# Patient Record
Sex: Female | Born: 1980 | Race: White | Hispanic: No | Marital: Single | Smoking: Current every day smoker
Health system: Southern US, Academic
[De-identification: ages and names within clinical notes are randomized; demographics above are authoritative.]

## PROBLEM LIST (undated history)

## (undated) DIAGNOSIS — F419 Anxiety disorder, unspecified: Secondary | ICD-10-CM

## (undated) DIAGNOSIS — F32A Depression, unspecified: Secondary | ICD-10-CM

## (undated) DIAGNOSIS — F1990 Other psychoactive substance use, unspecified, uncomplicated: Secondary | ICD-10-CM

## (undated) DIAGNOSIS — T50901A Poisoning by unspecified drugs, medicaments and biological substances, accidental (unintentional), initial encounter: Secondary | ICD-10-CM

## (undated) HISTORY — PX: CHOLECYSTECTOMY: SHX55

---

## 2008-01-12 ENCOUNTER — Emergency Department: Payer: Self-pay | Admitting: Unknown Physician Specialty

## 2008-08-13 ENCOUNTER — Emergency Department: Payer: Self-pay | Admitting: Emergency Medicine

## 2008-09-05 ENCOUNTER — Emergency Department: Payer: Self-pay | Admitting: Emergency Medicine

## 2009-08-06 ENCOUNTER — Emergency Department: Payer: Self-pay | Admitting: Emergency Medicine

## 2009-11-08 ENCOUNTER — Observation Stay: Payer: Self-pay | Admitting: Obstetrics and Gynecology

## 2010-11-03 ENCOUNTER — Ambulatory Visit: Payer: Self-pay | Admitting: Family Medicine

## 2015-11-15 ENCOUNTER — Emergency Department: Payer: Medicaid Other

## 2015-11-15 ENCOUNTER — Encounter: Payer: Self-pay | Admitting: Emergency Medicine

## 2015-11-15 ENCOUNTER — Other Ambulatory Visit: Payer: Self-pay

## 2015-11-15 ENCOUNTER — Emergency Department
Admission: EM | Admit: 2015-11-15 | Discharge: 2015-11-15 | Disposition: A | Discharge status: Home / Self Care | Payer: Medicaid Other | Attending: Emergency Medicine | Admitting: Emergency Medicine

## 2015-11-15 DIAGNOSIS — F17200 Nicotine dependence, unspecified, uncomplicated: Secondary | ICD-10-CM | POA: Insufficient documentation

## 2015-11-15 DIAGNOSIS — N3090 Cystitis, unspecified without hematuria: Secondary | ICD-10-CM | POA: Insufficient documentation

## 2015-11-15 DIAGNOSIS — F1410 Cocaine abuse, uncomplicated: Secondary | ICD-10-CM | POA: Diagnosis not present

## 2015-11-15 DIAGNOSIS — R0602 Shortness of breath: Secondary | ICD-10-CM | POA: Diagnosis present

## 2015-11-15 LAB — URINALYSIS COMPLETE WITH MICROSCOPIC (ARMC ONLY)
Bilirubin Urine: NEGATIVE
Glucose, UA: NEGATIVE mg/dL
Nitrite: POSITIVE — AB
PH: 5 (ref 5.0–8.0)
PROTEIN: 30 mg/dL — AB
Specific Gravity, Urine: 1.025 (ref 1.005–1.030)

## 2015-11-15 LAB — TROPONIN I: Troponin I: <0.03 ng/mL (ref <0.031)

## 2015-11-15 LAB — CBC WITH DIFFERENTIAL/PLATELET
Basophils Absolute: 0 10*3/uL (ref 0–0.1)
Basophils Relative: 0 %
Eosinophils Absolute: 0 10*3/uL (ref 0–0.7)
Eosinophils Relative: 0 %
HCT: 40 % (ref 35.0–47.0)
HEMOGLOBIN: 14 g/dL (ref 12.0–16.0)
LYMPHS ABS: 1.2 10*3/uL (ref 1.0–3.6)
LYMPHS PCT: 17 %
MCH: 30.4 pg (ref 26.0–34.0)
MCHC: 35 g/dL (ref 32.0–36.0)
MCV: 86.7 fL (ref 80.0–100.0)
Monocytes Absolute: 0.5 10*3/uL (ref 0.2–0.9)
Monocytes Relative: 7 %
NEUTROS ABS: 5.2 10*3/uL (ref 1.4–6.5)
NEUTROS PCT: 76 %
Platelets: 170 10*3/uL (ref 150–440)
RBC: 4.61 MIL/uL (ref 3.80–5.20)
RDW: 13.6 % (ref 11.5–14.5)
WBC: 6.9 10*3/uL (ref 3.6–11.0)

## 2015-11-15 LAB — BASIC METABOLIC PANEL
ANION GAP: 7 (ref 5–15)
BUN: 14 mg/dL (ref 6–20)
CHLORIDE: 106 mmol/L (ref 101–111)
CO2: 24 mmol/L (ref 22–32)
CREATININE: 0.67 mg/dL (ref 0.44–1.00)
Calcium: 9.2 mg/dL (ref 8.9–10.3)
GFR calc non Af Amer: >60 mL/min (ref >60)
Glucose, Bld: 115 mg/dL — ABNORMAL HIGH (ref 65–99)
Potassium: 3.6 mmol/L (ref 3.5–5.1)
Sodium: 137 mmol/L (ref 135–145)

## 2015-11-15 LAB — POCT PREGNANCY, URINE: PREG TEST UR: NEGATIVE

## 2015-11-15 LAB — CK: Total CK: 189 U/L (ref 38–234)

## 2015-11-15 MED ORDER — FOSFOMYCIN TROMETHAMINE 3 G PO PACK
3.0000 g | PACK | Freq: Once | ORAL | Status: AC
  Administered 2015-11-15: 3 g via ORAL
  Filled 2015-11-15: qty 3

## 2015-11-15 MED ORDER — SODIUM CHLORIDE 0.9 % IV BOLUS (SEPSIS)
1000.0000 mL | Freq: Once | INTRAVENOUS | Status: AC
  Administered 2015-11-15: 1000 mL via INTRAVENOUS

## 2015-11-15 MED ORDER — LORAZEPAM 1 MG PO TABS
1.0000 mg | ORAL_TABLET | Freq: Once | ORAL | Status: AC
  Administered 2015-11-15: 1 mg via ORAL
  Filled 2015-11-15: qty 1

## 2015-11-15 NOTE — Discharge Instructions (Signed)
Stimulant Use Disorder-Cocaine Cocaine is one of a group of powerful drugs called stimulants. Cocaine has medical uses for stopping nosebleeds and for pain control before minor nose or dental surgery. However, cocaine is misused because of the effects that it produces. These effects include:   A feeling of extreme pleasure.  Alertness.  High energy. Common street names for cocaine include coke, crack, blow, snow, and nose candy. Cocaine is snorted, dissolved in water and injected, or smoked.  Stimulants are addictive because they activate regions of the brain that produce both the pleasurable sensation of "reward" and psychological dependence. Together, these actions account for loss of control and the rapid development of drug dependence. This means you become ill without the drug (withdrawal) and need to keep using it to function.  Stimulant use disorder is use of stimulants that disrupts your daily life. It disrupts relationships with family and friends and how you do your job. Cocaine increases your blood pressure and heart rate. It can cause a heart attack or stroke. Cocaine can also cause death from irregular heart rate or seizures. SYMPTOMS Symptoms of stimulant use disorder with cocaine include:  Use of cocaine in larger amounts or over a longer period of time than intended.  Unsuccessful attempts to cut down or control cocaine use.  A lot of time spent obtaining, using, or recovering from the effects of cocaine.  A strong desire or urge to use cocaine (craving).  Continued use of cocaine in spite of major problems at work, school, or home because of use.  Continued use of cocaine in spite of relationship problems because of use.  Giving up or cutting down on important life activities because of cocaine use.  Use of cocaine over and over in situations when it is physically hazardous, such as driving a car.  Continued use of cocaine in spite of a physical problem that is likely  related to use. Physical problems can include:  Malnutrition.  Nosebleeds.  Chest pain.  High blood pressure.  A hole that develops between the part of your nose that separates your nostrils (perforated nasal septum).  Lung and kidney damage.  Continued use of cocaine in spite of a mental problem that is likely related to use. Mental problems can include:  Schizophrenia-like symptoms.  Depression.  Bipolar mood swings.  Anxiety.  Sleep problems.  Need to use more and more cocaine to get the same effect, or lessened effect over time with use of the same amount of cocaine (tolerance).  Having withdrawal symptoms when cocaine use is stopped, or using cocaine to reduce or avoid withdrawal symptoms. Withdrawal symptoms include:  Depressed or irritable mood.  Low energy or restlessness.  Bad dreams.  Poor or excessive sleep.  Increased appetite. DIAGNOSIS Stimulant use disorder is diagnosed by your health care provider. You may be asked questions about your cocaine use and how it affects your life. A physical exam may be done. A drug screen may be ordered. You may be referred to a mental health professional. The diagnosis of stimulant use disorder requires at least two symptoms within 12 months. The type of stimulant use disorder depends on the number of signs and symptoms you have. The type may be:  Mild. Two or three signs and symptoms.  Moderate. Four or five signs and symptoms.  Severe. Six or more signs and symptoms. TREATMENT Treatment for stimulant use disorder is usually provided by mental health professionals with training in substance use disorders. The following options are available:  Counseling or talk therapy. Talk therapy addresses the reasons you use cocaine and ways to keep you from using again. Goals of talk therapy include:  Identifying and avoiding triggers for use.  Handling cravings.  Replacing use with healthy activities.  Support groups.  Support groups provide emotional support, advice, and guidance.  Medicine. Certain medicines may decrease cocaine cravings or withdrawal symptoms. HOME CARE INSTRUCTIONS  Take medicines only as directed by your health care provider.  Identify the people and activities that trigger your cocaine use and avoid them.  Keep all follow-up visits as directed by your health care provider. SEEK MEDICAL CARE IF:  Your symptoms get worse or you relapse.  You are not able to take medicines as directed. SEEK IMMEDIATE MEDICAL CARE IF:  You have serious thoughts about hurting yourself or others.  You have a seizure, chest pain, sudden weakness, or loss of speech or vision. FOR MORE INFORMATION  National Institute on Drug Abuse: http://www.price-Korell.com/  Substance Abuse and Mental Health Services Administration: SkateOasis.com.pt   This information is not intended to replace advice given to you by your health care provider. Make sure you discuss any questions you have with your health care provider.   Document Released: 05/06/2000 Document Revised: 05/30/2014 Document Reviewed: 05/22/2013 Elsevier Interactive Patient Education 2016 [employer].  Polysubstance Abuse When people abuse more than one drug or type of drug it is called polysubstance or polydrug abuse. For example, many smokers also drink alcohol. This is one form of polydrug abuse. Polydrug abuse also refers to the use of a drug to counteract an unpleasant effect produced by another drug. It may also be used to help with withdrawal from another drug. People who take stimulants may become agitated. Sometimes this agitation is countered with a tranquilizer. This helps protect against the unpleasant side effects. Polydrug abuse also refers to the use of different drugs at the same time.  Anytime drug use is interfering with normal living activities, it has become abuse. This includes problems with family and friends. Psychological dependence has  developed when your mind tells you that the drug is needed. This is usually followed by physical dependence which has developed when continuing increases of drug are required to get the same feeling or "high". This is known as addiction or chemical dependency. A person's risk is much higher if there is a history of chemical dependency in the family. SIGNS OF CHEMICAL DEPENDENCY  You have been told by friends or family that drugs have become a problem.  You fight when using drugs.  You are having blackouts (not remembering what you do while using).  You feel sick from using drugs but continue using.  You lie about use or amounts of drugs (chemicals) used.  You need chemicals to get you going.  You are suffering in work performance or in school because of drug use.  You get sick from use of drugs but continue to use anyway.  You need drugs to relate to people or feel comfortable in social situations.  You use drugs to forget problems. "Yes" answered to any of the above signs of chemical dependency indicates there are problems. The longer the use of drugs continues, the greater the problems will become. If there is a family history of drug or alcohol use, it is best not to experiment with these drugs. Continual use leads to tolerance. After tolerance develops more of the drug is needed to get the same feeling. This is followed by addiction. With addiction,  drugs become the most important part of life. It becomes more important to take drugs than participate in the other usual activities of life. This includes relating to friends and family. Addiction is followed by dependency. Dependency is a condition where drugs are now needed not just to get high, but to feel normal. Addiction cannot be cured but it can be stopped. This often requires outside help and the care of professionals. Treatment centers are listed in the yellow pages under: Cocaine, Narcotics, and Alcoholics Anonymous. Most hospitals  and clinics can refer you to a specialized care center. Talk to your caregiver if you need help.   This information is not intended to replace advice given to you by your health care provider. Make sure you discuss any questions you have with your health care provider.   Document Released: 12/29/2004 Document Revised: 08/01/2011 Document Reviewed: 05/14/2014 Elsevier Interactive Patient Education 2016 Elsevier Inc.  Urinary Tract Infection Urinary tract infections (UTIs) can develop anywhere along your urinary tract. Your urinary tract is your body's drainage system for removing wastes and extra water. Your urinary tract includes two kidneys, two ureters, a bladder, and a urethra. Your kidneys are a pair of bean-shaped organs. Each kidney is about the size of your fist. They are located below your ribs, one on each side of your spine. CAUSES Infections are caused by microbes, which are microscopic organisms, including fungi, viruses, and bacteria. These organisms are so small that they can only be seen through a microscope. Bacteria are the microbes that most commonly cause UTIs. SYMPTOMS  Symptoms of UTIs may vary by age and gender of the patient and by the location of the infection. Symptoms in young women typically include a frequent and intense urge to urinate and a painful, burning feeling in the bladder or urethra during urination. Older women and men are more likely to be tired, shaky, and weak and have muscle aches and abdominal pain. A fever may mean the infection is in your kidneys. Other symptoms of a kidney infection include pain in your back or sides below the ribs, nausea, and vomiting. DIAGNOSIS To diagnose a UTI, your caregiver will ask you about your symptoms. Your caregiver will also ask you to provide a urine sample. The urine sample will be tested for bacteria and white blood cells. White blood cells are made by your body to help fight infection. TREATMENT  Typically, UTIs can be  treated with medication. Because most UTIs are caused by a bacterial infection, they usually can be treated with the use of antibiotics. The choice of antibiotic and length of treatment depend on your symptoms and the type of bacteria causing your infection. HOME CARE INSTRUCTIONS  If you were prescribed antibiotics, take them exactly as your caregiver instructs you. Finish the medication even if you feel better after you have only taken some of the medication.  Drink enough water and fluids to keep your urine clear or pale yellow.  Avoid caffeine, tea, and carbonated beverages. They tend to irritate your bladder.  Empty your bladder often. Avoid holding urine for long periods of time.  Empty your bladder before and after sexual intercourse.  After a bowel movement, women should cleanse from front to back. Use each tissue only once. SEEK MEDICAL CARE IF:   You have back pain.  You develop a fever.  Your symptoms do not begin to resolve within 3 days. SEEK IMMEDIATE MEDICAL CARE IF:   You have severe back pain or lower abdominal  pain.  You develop chills.  You have nausea or vomiting.  You have continued burning or discomfort with urination. MAKE SURE YOU:   Understand these instructions.  Will watch your condition.  Will get help right away if you are not doing well or get worse.   This information is not intended to replace advice given to you by your health care provider. Make sure you discuss any questions you have with your health care provider.   Document Released: 02/16/2005 Document Revised: 01/28/2015 Document Reviewed: 06/17/2011 Elsevier Interactive Patient Education 2016 [employer]. [employer] CorporationCommunity Resource Guide Outpatient Counseling/Substance Abuse Adult The United Ways 211 is a great source of information about community services available.  Access by dialing 2-1-1 from anywhere in [place], or by website -  PooledIncome.plwww.nc211.org.   Other Local Resources  (Updated 05/2015)  Crisis Hotlines   Services     Area Served  [facility]  Crisis Hotline, available 24 hours a day, 7 days a week: [phone] [facility] [place], [place]   [facility]  Crisis Hotline, available 24 hours a day, 7 days a week: [phone] [facility], [place]  [facility]  Suicide Prevention Hotline, available 24 hours a day, 7 days a week: [phone] [facility] [place], [place]  [facility]   Crisis Hotline, available 24 hours a day, 7 days a week: [phone] [facility] [place], [place]   [facility] Access to [employer] Line  Crisis Hotline, available 24 hours a day, 7 days a week: [phone] All   [facility]  Crisis Hotline, available 24 hours a day, 7 days a week: [phone] All   Other Local Resources (Updated 05/2015)  Outpatient Counseling/ Substance Abuse Programs  Services     Address and Phone Number  ADS (Alcohol and Drug Services)   Options include Individual counseling, group counseling, intensive outpatient program (several hours a day, several days a week)  Offers depression assessments  Provides methadone maintenance program [phone] [address], [address] [place], [place] [address]   [facility]   Offers partial hospitalization/day treatment and DUI/DWI programs  [employer]s Medicare, private insurance [phone] [address], [address] [place], [place] [address]  [facility]    Services include intensive outpatient program (several hours a day, several days a week), outpatient treatment, DUI/DWI services, family education  Also has some services specifically for [employer]  Offers transitional housing  [phone] [address] [place], [place] [address]     [facility]  [employer]s Medicare, private pay, and private insurance [phone] [address], [address] [place], [place] [address]  [facility]  Services include  individual counseling, substance abuse intensive outpatient program (several hours a day, several days a week), day treatment  Delene Lollccepts Medicare, Medicaid, private insurance [phone] [address], [address] [place], [place] [address]  [facility]   Offers substance abuse intensive outpatient program (several hours a day, several days a week), partial hospitalization program [phone] [address] [place], [place] [address]  [phone] [address] [place], [place] [address]  [phone] [address] [place], [place] [address]  [phone] [address] [place], [place] [address]  [facility]  Individual counseling only  Accepts private insurance only [phone] [address], [address] [place], [place] [address]  [facility]  Methadone maintenance program [phone] [address] [place], [place] [address]  [facility]  Walk-In Clinic providing substance abuse and mental health counseling  Accepts Medicaid,  Medicare, private insurance  Offers sliding scale for uninsured [phone] [address] [place], [place]   [facility] [place], [employer].  Offers individual counseling, and intensive in-home services [phone] [address] [place], [place] [address]  [facility] individual counseling, family counseling, group therapy, domestic violence counseling, consumer credit counseling  Accepts Medicare, Medicaid, private insurance  Offers sliding scale for uninsured [phone] [address] [place], [place] [address]  [phone] [facility], [address] [place], [place] [address]  [facility]  Offers individual, family and group counseling  3 locations - [place], [place], and [place]  [phone]  [address] [place], [place] [address]  [address] [place], [place] [address]  [address] [place], [place] [address]  [facility]    Offers psychiatric assessment, 8-week Intensive Outpatient Program (several hours a day, several times a week, daytime or evenings), early recovery group, family Program, medication management  Private pay or private insurance only [phone], or  [phone] [address] [place], [place] [address]  [place] [employer] individual, couples and family counseling  Accepts Medicaid, private insurance, and sliding scale for uninsured [phone] [address] [place], [place] [address]  Len Blalock, MD  Individual counseling  Private insurance [phone] [address] [place], [place] [address]  [facility]   Offers assessment, substance abuse treatment, and behavioral health treatment [phone] [address] [place], [place] [address]  [facility]  Individual counseling  Accepts private insurance [phone] [address] [place], [place] [address]  [facility]  Individual counseling  Delene Loll, private insurance [phone] [address] [place], [place] [address]  [facility]    Offers intensive outpatient program (several hours a day, several times a week)  Private pay, private insurance [phone] [address] [place], [place]  [facility]  Individual counseling  Medicare, private insurance [phone] [address] [place], [place] [address]  [facility]    Offers intensive outpatient program (several hours a day, several times a week) and partial hospitalization program [phone] [address] [place], [place] [address]  Emerson Monte, MD  Individual counseling [phone] [address], [address] [place], [place] [address]  [facility]  Offers Christian counseling to individuals, couples,  and families  Accepts Medicare and private insurance; offers sliding scale for uninsured [phone] [address] [place], [place] [address]  [facility]  May counseling [phone] [address] [place], [place] [address]  [facility] crisis counseling, individual counseling, group therapy, in-home therapy, domestic violence services, day treatment, DWI services, Administrator, arts (CST), Assertive Community Treatment Team (ACTT), substance abuse Intensive Outpatient Program (several hours a day, several times a week)  2 locations - [place] and [place] [phone] [address] [place], [place] [address]  [phone] [address] [place], [place] [address]  [facility]     Individual counseling and group therapy  Accepts private insurance, [place], [place] [phone] [address] [place], [place]  [facility]  Offers individual and family counseling  Offers LGBTQ services  Accepts private insurance and private pay [phone] [address] [place], [place] [address]  [facility]    Offers individual counseling, group therapy, and outpatient detox  Accepts private insurance [phone] [address] [place], [place]  [facility]  Individual  counseling  Accepts Medicare, private insurance [phone] [address] [place], [place] [address]  [employer]  Individual counseling  Accepts Medicare, private insurance [phone] [address] [place], [place] [address]  [facility]   Offers substance abuse Intensive Outpatient Program (several hours a day, several times a week) [phone], or [phone] [place], [place]

## 2015-11-15 NOTE — ED Notes (Signed)
Pt received bed change.
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
## 2015-11-15 NOTE — ED Notes (Signed)
Pt verbalized understanding of discharge instructions. Pt given information for [facility] and other facilities in the area. NAD at this time.   JA

## 2015-11-15 NOTE — ED Notes (Signed)
Patient brought in by ems from home. Patient reports using cocaine and methadone all day. Patient started hallucinating that there were people in her house.

## 2015-11-15 NOTE — ED Notes (Signed)
Pt requested psych consult.

## 2015-11-15 NOTE — ED Notes (Signed)
Pt attempting to contact friend/family for ride home.

## 2015-11-17 LAB — URINE CULTURE: Culture: >=100000 — AB

## 2016-07-18 ENCOUNTER — Emergency Department: Payer: Self-pay

## 2016-07-18 ENCOUNTER — Encounter: Payer: Self-pay | Admitting: Emergency Medicine

## 2016-07-18 ENCOUNTER — Emergency Department
Admission: EM | Admit: 2016-07-18 | Discharge: 2016-07-19 | Disposition: A | Discharge status: Other Acute Inpatient Hospital | Payer: Self-pay | Attending: Emergency Medicine | Admitting: Emergency Medicine

## 2016-07-18 DIAGNOSIS — F15951 Other stimulant use, unspecified with stimulant-induced psychotic disorder with hallucinations: Secondary | ICD-10-CM

## 2016-07-18 DIAGNOSIS — F1590 Other stimulant use, unspecified, uncomplicated: Secondary | ICD-10-CM

## 2016-07-18 DIAGNOSIS — F332 Major depressive disorder, recurrent severe without psychotic features: Secondary | ICD-10-CM

## 2016-07-18 DIAGNOSIS — F1593 Other stimulant use, unspecified with withdrawal: Secondary | ICD-10-CM

## 2016-07-18 DIAGNOSIS — N390 Urinary tract infection, site not specified: Secondary | ICD-10-CM | POA: Insufficient documentation

## 2016-07-18 DIAGNOSIS — F1120 Opioid dependence, uncomplicated: Secondary | ICD-10-CM

## 2016-07-18 DIAGNOSIS — F1920 Other psychoactive substance dependence, uncomplicated: Secondary | ICD-10-CM

## 2016-07-18 DIAGNOSIS — Z5181 Encounter for therapeutic drug level monitoring: Secondary | ICD-10-CM | POA: Insufficient documentation

## 2016-07-18 DIAGNOSIS — G9340 Encephalopathy, unspecified: Secondary | ICD-10-CM | POA: Insufficient documentation

## 2016-07-18 DIAGNOSIS — F1994 Other psychoactive substance use, unspecified with psychoactive substance-induced mood disorder: Secondary | ICD-10-CM | POA: Insufficient documentation

## 2016-07-18 DIAGNOSIS — F1910 Other psychoactive substance abuse, uncomplicated: Secondary | ICD-10-CM

## 2016-07-18 DIAGNOSIS — F17210 Nicotine dependence, cigarettes, uncomplicated: Secondary | ICD-10-CM | POA: Insufficient documentation

## 2016-07-18 DIAGNOSIS — F1523 Other stimulant dependence with withdrawal: Secondary | ICD-10-CM | POA: Insufficient documentation

## 2016-07-18 DIAGNOSIS — F1420 Cocaine dependence, uncomplicated: Secondary | ICD-10-CM

## 2016-07-18 HISTORY — DX: Other psychoactive substance use, unspecified, uncomplicated: F19.90

## 2016-07-18 LAB — CBC WITH DIFFERENTIAL/PLATELET
BASOS ABS: 0 10*3/uL (ref 0–0.1)
BASOS PCT: 0 %
Eosinophils Absolute: 0.2 10*3/uL (ref 0–0.7)
Eosinophils Relative: 2 %
HEMATOCRIT: 35 % (ref 35.0–47.0)
Hemoglobin: 12.5 g/dL (ref 12.0–16.0)
Lymphocytes Relative: 30 %
Lymphs Abs: 2.4 10*3/uL (ref 1.0–3.6)
MCH: 32 pg (ref 26.0–34.0)
MCHC: 35.8 g/dL (ref 32.0–36.0)
MCV: 89.5 fL (ref 80.0–100.0)
MONOS PCT: 9 %
Monocytes Absolute: 0.7 10*3/uL (ref 0.2–0.9)
NEUTROS ABS: 4.6 10*3/uL (ref 1.4–6.5)
Neutrophils Relative %: 59 %
Platelets: 167 10*3/uL (ref 150–440)
RBC: 3.92 MIL/uL (ref 3.80–5.20)
RDW: 13 % (ref 11.5–14.5)
WBC: 7.9 10*3/uL (ref 3.6–11.0)

## 2016-07-18 LAB — POCT PREGNANCY, URINE: Preg Test, Ur: NEGATIVE

## 2016-07-18 LAB — COMPREHENSIVE METABOLIC PANEL
ALBUMIN: 3.6 g/dL (ref 3.5–5.0)
ALK PHOS: 76 U/L (ref 38–126)
ALT: 46 U/L (ref 14–54)
AST: 28 U/L (ref 15–41)
Anion gap: 6 (ref 5–15)
BILIRUBIN TOTAL: 0.9 mg/dL (ref 0.3–1.2)
BUN: 14 mg/dL (ref 6–20)
CALCIUM: 8.7 mg/dL — AB (ref 8.9–10.3)
CO2: 28 mmol/L (ref 22–32)
Chloride: 101 mmol/L (ref 101–111)
Creatinine, Ser: 0.54 mg/dL (ref 0.44–1.00)
GFR calc Af Amer: >60 mL/min (ref >60)
GFR calc non Af Amer: >60 mL/min (ref >60)
GLUCOSE: 99 mg/dL (ref 65–99)
Potassium: 3.3 mmol/L — ABNORMAL LOW (ref 3.5–5.1)
SODIUM: 135 mmol/L (ref 135–145)
TOTAL PROTEIN: 6.7 g/dL (ref 6.5–8.1)

## 2016-07-18 LAB — URINALYSIS, COMPLETE (UACMP) WITH MICROSCOPIC
BILIRUBIN URINE: NEGATIVE
Glucose, UA: NEGATIVE mg/dL
KETONES UR: 5 mg/dL — AB
Nitrite: POSITIVE — AB
PH: 5 (ref 5.0–8.0)
Protein, ur: 30 mg/dL — AB
SPECIFIC GRAVITY, URINE: 1.025 (ref 1.005–1.030)

## 2016-07-18 LAB — URINE DRUG SCREEN, QUALITATIVE (ARMC ONLY)
Amphetamines, Ur Screen: POSITIVE — AB
BARBITURATES, UR SCREEN: NOT DETECTED
BENZODIAZEPINE, UR SCRN: NOT DETECTED
COCAINE METABOLITE, UR ~~LOC~~: POSITIVE — AB
Cannabinoid 50 Ng, Ur ~~LOC~~: NOT DETECTED
MDMA (Ecstasy)Ur Screen: NOT DETECTED
Methadone Scn, Ur: NOT DETECTED
OPIATE, UR SCREEN: POSITIVE — AB
PHENCYCLIDINE (PCP) UR S: NOT DETECTED
Tricyclic, Ur Screen: NOT DETECTED

## 2016-07-18 LAB — CK: CK TOTAL: 88 U/L (ref 38–234)

## 2016-07-18 LAB — LACTIC ACID, PLASMA: Lactic Acid, Venous: 0.7 mmol/L (ref 0.5–1.9)

## 2016-07-18 MED ORDER — SODIUM CHLORIDE 0.9 % IV BOLUS (SEPSIS)
1000.0000 mL | Freq: Once | INTRAVENOUS | Status: AC
  Administered 2016-07-18: 1000 mL via INTRAVENOUS

## 2016-07-18 MED ORDER — NALOXONE HCL 2 MG/2ML IJ SOSY
0.4000 mg | PREFILLED_SYRINGE | Freq: Once | INTRAMUSCULAR | Status: AC
  Administered 2016-07-18: 0.4 mg via INTRAVENOUS
  Filled 2016-07-18: qty 2

## 2016-07-18 MED ORDER — ONDANSETRON HCL 4 MG/2ML IJ SOLN
4.0000 mg | Freq: Once | INTRAMUSCULAR | Status: AC
  Administered 2016-07-18: 4 mg via INTRAVENOUS
  Filled 2016-07-18: qty 2

## 2016-07-18 MED ORDER — PROMETHAZINE HCL 12.5 MG PO TABS: 12.5000 mg | ORAL_TABLET | Freq: Four times a day (QID) | ORAL | 0 refills | Status: DC | PRN

## 2016-07-18 MED ORDER — CLONIDINE HCL 0.1 MG PO TABS: 0.1000 mg | ORAL_TABLET | Freq: Two times a day (BID) | ORAL | 0 refills | Status: DC

## 2016-07-18 MED ORDER — CEPHALEXIN 500 MG PO CAPS: 500.0000 mg | ORAL_CAPSULE | Freq: Four times a day (QID) | ORAL | 0 refills | Status: DC

## 2016-07-18 MED ORDER — CEPHALEXIN 500 MG PO CAPS
500.0000 mg | ORAL_CAPSULE | Freq: Once | ORAL | Status: AC
  Administered 2016-07-18: 500 mg via ORAL
  Filled 2016-07-18: qty 1

## 2016-07-18 NOTE — ED Notes (Signed)

## 2016-07-18 NOTE — ED Notes (Signed)
Pt found IV in her hair. She must have pulled it out at some point from the site in her neck.  LM EDT

## 2016-07-18 NOTE — ED Provider Notes (Addendum)
Patient signed out to me by Dr. Roxan Hockeyobinson. The patient is a 36 year old woman who is been using cocaine and methamphetamine for the past several days and comes to the ER from a homeless shelter for her somnolence. When I evaluated her at this point she is easily arousable but falls asleep while talking to me which is consistent with norepinephrine depletion syndrome. She has a concomitant urinary tract infection. She will require prolonged observation in order for her to adequately wake up. She has a family history of hypertension, uses amphetamines, and has no known past medical history aside from drug abuse.  OBSERVATION TIME BEGAN AT 1500   Merrily BrittleNeil Jermiya Reichl, MD 07/18/16 1704  I attempted to rouse the patient at 2200 and she is still too sleepy to leave.   Merrily BrittleNeil Nishtha Raider, MD 07/18/16 2153

## 2016-07-18 NOTE — ED Notes (Signed)
Patient transported to CT 
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
## 2016-07-18 NOTE — ED Notes (Signed)
TTS at bedside. 

## 2016-07-18 NOTE — ED Triage Notes (Addendum)
Pt in via EMS from homeless shelter; pt states, "everybody thinks I'm high."  Pt lethargic but arousable to touch.  Pt reports last drug use at 0000 today, using heroin and meth.  Pt reports being on a "meth binge for last couple of months and I have not been right since then."  Pt appears to have cellulitis to BLE.  Vitals WDL.  MD to bedside.

## 2016-07-18 NOTE — ED Provider Notes (Signed)
[facility] Emergency Department Provider Note    First MD Initiated Contact with Patient 07/18/16 1232     (approximate)  I have reviewed the triage vital signs and the nursing notes.   HISTORY  Chief Complaint Drug Overdose  Level V Caveat:  Toxicologic encephalopathy, acute  HPI Emily Hutchinson is a 36 y.o. female with polysubstance abuse presents from the homeless shelter because she was drowsy and difficult to arouse. Patient states "everyone thinks I'm high". Patient admits to going on a "binge for the last couple of months "states her last use of heroin was last night. Admits to using meth as well as cocaine. States that she did not sleep last night. No head trauma. Denies any pain. No fevers. No nausea or vomiting.   Past Medical History:  Diagnosis Date  . IV drug user    No family history on file. Past Surgical History:  Procedure Laterality Date  . CESAREAN SECTION    . CHOLECYSTECTOMY     There are no active problems to display for this patient.     Prior to Admission medications   Not on File    Allergies Adhesive [tape]    Social History Social History  Substance Use Topics  . Smoking status: Current Every Day Smoker    Packs/day: 1.00    Types: Cigarettes  . Smokeless tobacco: Never Used  . Alcohol use No    Review of Systems Patient denies headaches, rhinorrhea, blurry vision, numbness, shortness of breath, chest pain, edema, cough, abdominal pain, nausea, vomiting, diarrhea, dysuria, fevers, rashes or hallucinations unless otherwise stated above in HPI. ____________________________________________   PHYSICAL EXAM:  VITAL SIGNS: Vitals:   07/18/16 1224  BP: 120/68  Pulse: 70  Resp: 15  Temp: 98.2 F (36.8 C)    Constitutional: drowsy and intoxicated appearing but  in no acute distress. Eyes: Conjunctivae are normal.  Pupils 2mm and reactive. EOMI. Head: Atraumatic. Nose: No  congestion/rhinnorhea. Mouth/Throat: Mucous membranes are moist.  Oropharynx non-erythematous. Neck: No stridor. Painless ROM. No cervical spine tenderness to palpation Hematological/Lymphatic/Immunilogical: No cervical lymphadenopathy. Cardiovascular: Normal rate, regular rhythm. Grossly normal heart sounds.  Good peripheral circulation. Respiratory: Normal respiratory effort.  No retractions. Lungs CTAB. Gastrointestinal: Soft and nontender. No distention. No abdominal bruits. No CVA tenderness. Musculoskeletal: No lower extremity tenderness nor edema.  No joint effusions. Neurologic:  No gross focal neurologic deficits are appreciated. No facial droop, MAE spontaneously Skin:  Skin is warm, dry and intact. BLE with erythema, no crepitus, induration, or blistering   ____________________________________________   LABS (all labs ordered are listed, but only abnormal results are displayed)  Results for orders placed or performed during the hospital encounter of 07/18/16 (from the past 24 hour(s))  Urine Drug Screen, Qualitative ([facility] only)     Status: Abnormal   Collection Time: 07/18/16 12:33 PM  Result Value Ref Range   Tricyclic, Ur Screen NONE DETECTED NONE DETECTED   Amphetamines, Ur Screen POSITIVE (A) NONE DETECTED   MDMA (Ecstasy)Ur Screen NONE DETECTED NONE DETECTED   Cocaine Metabolite,Ur [place] POSITIVE (A) NONE DETECTED   Opiate, Ur Screen POSITIVE (A) NONE DETECTED   Phencyclidine (PCP) Ur S NONE DETECTED NONE DETECTED   Cannabinoid 50 Ng, Ur [place] NONE DETECTED NONE DETECTED   Barbiturates, Ur Screen NONE DETECTED NONE DETECTED   Benzodiazepine, Ur Scrn NONE DETECTED NONE DETECTED   Methadone Scn, Ur NONE DETECTED NONE DETECTED  Urinalysis, Complete w Microscopic     Status: Abnormal  Collection Time: 07/18/16 12:33 PM  Result Value Ref Range   Color, Urine AMBER (A) YELLOW   APPearance CLEAR (A) CLEAR   Specific Gravity, Urine 1.025 1.005 - 1.030   pH 5.0 5.0 - 8.0    Glucose, UA NEGATIVE NEGATIVE mg/dL   Hgb urine dipstick MODERATE (A) NEGATIVE   Bilirubin Urine NEGATIVE NEGATIVE   Ketones, ur 5 (A) NEGATIVE mg/dL   Protein, ur 30 (A) NEGATIVE mg/dL   Nitrite POSITIVE (A) NEGATIVE   Leukocytes, UA SMALL (A) NEGATIVE   RBC / HPF 0-5 0 - 5 RBC/hpf   WBC, UA 6-30 0 - 5 WBC/hpf   Bacteria, UA MANY (A) NONE SEEN   Squamous Epithelial / LPF 0-5 (A) NONE SEEN   Mucous PRESENT   Pregnancy, urine POC     Status: None   Collection Time: 07/18/16 12:59 PM  Result Value Ref Range   Preg Test, Ur NEGATIVE NEGATIVE  CBC with Differential/Platelet     Status: None   Collection Time: 07/18/16  1:42 PM  Result Value Ref Range   WBC 7.9 3.6 - 11.0 K/uL   RBC 3.92 3.80 - 5.20 MIL/uL   Hemoglobin 12.5 12.0 - 16.0 g/dL   HCT 16.1 09.6 - 04.5 %   MCV 89.5 80.0 - 100.0 fL   MCH 32.0 26.0 - 34.0 pg   MCHC 35.8 32.0 - 36.0 g/dL   RDW 40.9 81.1 - 91.4 %   Platelets 167 150 - 440 K/uL   Neutrophils Relative % 59 %   Neutro Abs 4.6 1.4 - 6.5 K/uL   Lymphocytes Relative 30 %   Lymphs Abs 2.4 1.0 - 3.6 K/uL   Monocytes Relative 9 %   Monocytes Absolute 0.7 0.2 - 0.9 K/uL   Eosinophils Relative 2 %   Eosinophils Absolute 0.2 0 - 0.7 K/uL   Basophils Relative 0 %   Basophils Absolute 0.0 0 - 0.1 K/uL  Comprehensive metabolic panel     Status: Abnormal   Collection Time: 07/18/16  1:42 PM  Result Value Ref Range   Sodium 135 135 - 145 mmol/L   Potassium 3.3 (L) 3.5 - 5.1 mmol/L   Chloride 101 101 - 111 mmol/L   CO2 28 22 - 32 mmol/L   Glucose, Bld 99 65 - 99 mg/dL   BUN 14 6 - 20 mg/dL   Creatinine, Ser 7.82 0.44 - 1.00 mg/dL   Calcium 8.7 (L) 8.9 - 10.3 mg/dL   Total Protein 6.7 6.5 - 8.1 g/dL   Albumin 3.6 3.5 - 5.0 g/dL   AST 28 15 - 41 U/L   ALT 46 14 - 54 U/L   Alkaline Phosphatase 76 38 - 126 U/L   Total Bilirubin 0.9 0.3 - 1.2 mg/dL   GFR calc non Af Amer >60 >60 mL/min   GFR calc Af Amer >60 >60 mL/min   Anion gap 6 5 - 15  Lactic acid, plasma      Status: None   Collection Time: 07/18/16  1:42 PM  Result Value Ref Range   Lactic Acid, Venous 0.7 0.5 - 1.9 mmol/L   ____________________________________________  EKG My review and personal interpretation at Time: 12:41   Indication: od  Rate: 60  Rhythm: sinus Axis: normal Other: normal intervals, no STEMI ____________________________________________  RADIOLOGY   ____________________________________________   PROCEDURES  Procedure(s) performed:  Procedures    Critical Care performed: no ____________________________________________   INITIAL IMPRESSION / ASSESSMENT AND PLAN / ED COURSE  Pertinent  labs & imaging results that were available during my care of the patient were reviewed by me and considered in my medical decision making (see chart for details).  DDX: Dehydration, sepsis, pna, uti, hypoglycemia, cva, drug effect, withdrawal, encephalitis   Vernadine Coombs is a 36 y.o. who presents to the ED with altered mental status as described above. Blood work is reassuring. Patient hemodynamic stable. No significant response to Narcan. He suspect that this is largely secondary to prolonged stimulant abuse "bender."  Renal function is otherwise at baseline. Patient does admit to some dysuria we'll treat empirically for UTI. No evidence of sepsis.  Patient receiving IV fluids. Remains stable monitor. Patient will be signed out to Dr. Adelina Mings pending further monitoring and clinical sobriety.        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     Willy Eddy, MD 07/18/16 [phone]

## 2016-07-18 NOTE — ED Notes (Signed)
TTS complete 

## 2016-07-18 NOTE — ED Notes (Signed)

## 2016-07-18 NOTE — ED Notes (Signed)
Pt very hard stick due to being an IV drug user.  Unable to obtain blood specimens or IV at this time.  MD aware.

## 2016-07-18 NOTE — ED Notes (Signed)
Pt dressed out in burgandy scrubs; all belongings placed in pt belonging bag and labeled with green sticker.  Pt alert, attentive and cooperative at this time.  Pt requesting food; [place] sandwich tray provided.

## 2016-07-18 NOTE — ED Notes (Signed)
Pt resting in bed eating, given warm blanket

## 2016-07-19 ENCOUNTER — Inpatient Hospital Stay
Admit: 2016-07-19 | Discharge: 2016-07-22 | DRG: 897 | Disposition: A | Discharge status: Home / Self Care | Payer: No Typology Code available for payment source | Attending: Psychiatry | Admitting: Psychiatry

## 2016-07-19 ENCOUNTER — Encounter: Payer: Self-pay | Admitting: Psychiatry

## 2016-07-19 DIAGNOSIS — F1420 Cocaine dependence, uncomplicated: Secondary | ICD-10-CM | POA: Diagnosis present

## 2016-07-19 DIAGNOSIS — F17200 Nicotine dependence, unspecified, uncomplicated: Secondary | ICD-10-CM | POA: Diagnosis present

## 2016-07-19 DIAGNOSIS — F15951 Other stimulant use, unspecified with stimulant-induced psychotic disorder with hallucinations: Principal | ICD-10-CM | POA: Diagnosis present

## 2016-07-19 DIAGNOSIS — N390 Urinary tract infection, site not specified: Secondary | ICD-10-CM | POA: Diagnosis present

## 2016-07-19 DIAGNOSIS — F419 Anxiety disorder, unspecified: Secondary | ICD-10-CM | POA: Diagnosis present

## 2016-07-19 DIAGNOSIS — F332 Major depressive disorder, recurrent severe without psychotic features: Secondary | ICD-10-CM | POA: Diagnosis present

## 2016-07-19 DIAGNOSIS — Z590 Homelessness: Secondary | ICD-10-CM | POA: Diagnosis not present

## 2016-07-19 DIAGNOSIS — R45851 Suicidal ideations: Secondary | ICD-10-CM | POA: Diagnosis present

## 2016-07-19 DIAGNOSIS — Z818 Family history of other mental and behavioral disorders: Secondary | ICD-10-CM

## 2016-07-19 DIAGNOSIS — Z915 Personal history of self-harm: Secondary | ICD-10-CM

## 2016-07-19 DIAGNOSIS — F1120 Opioid dependence, uncomplicated: Secondary | ICD-10-CM | POA: Diagnosis present

## 2016-07-19 DIAGNOSIS — Z91048 Other nonmedicinal substance allergy status: Secondary | ICD-10-CM

## 2016-07-19 DIAGNOSIS — G4700 Insomnia, unspecified: Secondary | ICD-10-CM | POA: Diagnosis present

## 2016-07-19 DIAGNOSIS — Z79899 Other long term (current) drug therapy: Secondary | ICD-10-CM

## 2016-07-19 DIAGNOSIS — F1590 Other stimulant use, unspecified, uncomplicated: Secondary | ICD-10-CM | POA: Diagnosis present

## 2016-07-19 DIAGNOSIS — F17210 Nicotine dependence, cigarettes, uncomplicated: Secondary | ICD-10-CM | POA: Diagnosis present

## 2016-07-19 MED ORDER — NICOTINE 21 MG/24HR TD PT24
21.0000 mg | MEDICATED_PATCH | Freq: Every day | TRANSDERMAL | Status: DC
  Administered 2016-07-20 – 2016-07-22 (×3): 21 mg via TRANSDERMAL
  Filled 2016-07-19 (×3): qty 1

## 2016-07-19 MED ORDER — ONDANSETRON HCL 4 MG PO TABS: 4.0000 mg | ORAL_TABLET | Freq: Three times a day (TID) | ORAL | Status: DC | PRN

## 2016-07-19 MED ORDER — ALUM & MAG HYDROXIDE-SIMETH 200-200-20 MG/5ML PO SUSP: 30.0000 mL | ORAL | Status: DC | PRN

## 2016-07-19 MED ORDER — HALOPERIDOL 0.5 MG PO TABS
0.5000 mg | ORAL_TABLET | Freq: Two times a day (BID) | ORAL | Status: DC
  Administered 2016-07-19 – 2016-07-22 (×6): 0.5 mg via ORAL
  Filled 2016-07-19 (×6): qty 1

## 2016-07-19 MED ORDER — CLONIDINE HCL 0.1 MG PO TABS: 0.1000 mg | ORAL_TABLET | Freq: Four times a day (QID) | ORAL | Status: DC | PRN

## 2016-07-19 MED ORDER — TRAZODONE HCL 100 MG PO TABS
100.0000 mg | ORAL_TABLET | Freq: Every evening | ORAL | Status: DC | PRN
  Administered 2016-07-19 – 2016-07-21 (×3): 100 mg via ORAL
  Filled 2016-07-19 (×3): qty 1

## 2016-07-19 MED ORDER — HYDROXYZINE HCL 25 MG PO TABS
25.0000 mg | ORAL_TABLET | Freq: Three times a day (TID) | ORAL | Status: DC | PRN
  Administered 2016-07-19 – 2016-07-21 (×3): 25 mg via ORAL
  Filled 2016-07-19 (×3): qty 1

## 2016-07-19 MED ORDER — ACETAMINOPHEN 325 MG PO TABS: 650.0000 mg | ORAL_TABLET | Freq: Four times a day (QID) | ORAL | Status: DC | PRN

## 2016-07-19 MED ORDER — NICOTINE 21 MG/24HR TD PT24
21.0000 mg | MEDICATED_PATCH | Freq: Every day | TRANSDERMAL | Status: DC
  Administered 2016-07-19: 21 mg via TRANSDERMAL
  Filled 2016-07-19: qty 1

## 2016-07-19 MED ORDER — MAGNESIUM HYDROXIDE 400 MG/5ML PO SUSP: 30.0000 mL | Freq: Every day | ORAL | Status: DC | PRN

## 2016-07-19 MED ORDER — CLONIDINE HCL 0.1 MG PO TABS
0.1000 mg | ORAL_TABLET | Freq: Four times a day (QID) | ORAL | Status: DC | PRN
  Administered 2016-07-19 – 2016-07-21 (×3): 0.1 mg via ORAL
  Filled 2016-07-19 (×3): qty 1

## 2016-07-19 NOTE — ED Notes (Signed)

## 2016-07-19 NOTE — ED Notes (Signed)
Pt resting in bed with eyes closed. Pt voices no needs or concerns. Pt has forwarded very little to this Clinical research associatewriter. Maintained on 15 minute checks and observation by security camera for safety.

## 2016-07-19 NOTE — ED Notes (Signed)
Patient is still out of it although she will answer direct questions.  She appears glassy eyed and does not stay alert for very long unless you keep interacting with her.  She denies any pain.  When I ask if she feels any better after all this time in the ER, she says she is "still very sleepy".

## 2016-07-19 NOTE — ED Notes (Signed)
Patient observed lying in bed with eyes closed  Even, unlabored respirations observed   NAD pt appears to be sleeping  I will continue to monitor along with every 15 minute visual observations and ongoing security monitoring    

## 2016-07-19 NOTE — ED Notes (Addendum)
Pt awake -sitting up in bed  She has eaten her breakfast  Denies pain  Reports  "I don't know where I am gonna go when I leave here - I feel fine."  Covering up with blankets after setting her empty tray to the floor   Assessment completed
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
## 2016-07-19 NOTE — ED Notes (Signed)
ED  Is the patient under IVC or is there intent for IVC:  no  Is the patient medically cleared: Yes.   Is there vacancy in the ED BHU: Yes.   Is the population mix appropriate for patient: Yes.   Is the patient awaiting placement in inpatient or outpatient setting:  Has the patient had a psychiatric consult:  Not ordered     Survey of unit performed for contraband, proper placement and condition of furniture, tampering with fixtures in bathroom, shower, and each patient room: Yes.  ; Findings:  APPEARANCE/BEHAVIOR Calm and cooperative NEURO ASSESSMENT Orientation: oriented x3  Denies pain Hallucinations: No.None noted (Hallucinations) Speech: Normal Gait: normal RESPIRATORY ASSESSMENT Even  Unlabored respirations  CARDIOVASCULAR ASSESSMENT Pulses equal   regular rate  Skin warm and dry   GASTROINTESTINAL ASSESSMENT no GI complaint EXTREMITIES Full ROM  PLAN OF CARE Provide calm/safe environment. Vital signs assessed twice daily. ED BHU Assessment once each 12-hour shift. Collaborate with TTS daily or as condition indicates. Assure the ED provider has rounded once each shift. Provide and encourage hygiene. Provide redirection as needed. Assess for escalating behavior; address immediately and inform ED provider.  Assess family dynamic and appropriateness for visitation as needed: Yes.  ; If necessary, describe findings:  Educate the patient/family about BHU procedures/visitation: Yes.  ; If necessary, describe findings:

## 2016-07-19 NOTE — ED Notes (Signed)

## 2016-07-19 NOTE — ED Notes (Signed)
Pt transferred to BMU. All belongings sent with pt. Pt has signed Voluntary Consent form.

## 2016-07-19 NOTE — ED Notes (Signed)
Report called to TimkenPhyllis, RN in BMU.  All belongings will be sent with patient. Pt has been calm, cooperative, resting in bed with eyes closed. Maintained on 15 minute checks and observation by security camera for safety.

## 2016-07-19 NOTE — ED Notes (Addendum)
Pt sitting on bed upon approach. Pt did not want to answer RN's questions. "I have already told my story 10 times."  Pt became tearful. RN told pt to make staff aware of any concerns or needs. Pt accepting. Pt given soft drink and allowed to use remote. Maintained on 15 minute checks and observation by security camera for safety.  Unable to complete psych assessment at this time.

## 2016-07-19 NOTE — ED Notes (Signed)
Lunch was given to patient 

## 2016-07-19 NOTE — ED Notes (Signed)
BEHAVIORAL HEALTH ROUNDING  PATIENT SLEEPING: Yes Patient alert & oriented: Sleeping Behavior appropriate: Sleeping Describe behavior: No inappropriate or unacceptable behaviors noted at this time. Nutrition & fluids offered: Sleeping Toileting & Hygiene offered: Sleeping Sitter present: Behavioral tech rounding every 15 minutes on patient to ensure safety. Law enforcement present: Yes Law enforcement agency: [employer] ([employer])  

## 2016-07-19 NOTE — ED Notes (Signed)
Pt to transfer to BHU 4 at this time  She is voluntary  Plan to admit to [facility] BMU  Pt ambulatory with a steady gait  NAD assessed

## 2016-07-19 NOTE — Tx Team (Signed)
Initial Treatment Plan 07/19/2016 4:09 PM Emily JohannMollie Verdone [id number]    PATIENT STRESSORS: Financial difficulties Health problems Marital or family conflict Occupational concerns Substance abuse   PATIENT STRENGTHS: Ability for insight Average or above average intelligence Communication skills General fund of knowledge   PATIENT IDENTIFIED PROBLEMS: Amphetamine psychosis  hallucinations                   DISCHARGE CRITERIA:  Improved stabilization in mood, thinking, and/or behavior  PRELIMINARY DISCHARGE PLAN: Outpatient therapy  PATIENT/FAMILY INVOLVEMENT: This treatment plan has been presented to and reviewed with the patient, Emily Hutchinson, and/or family member, .  The patient and family have been given the opportunity to ask questions and make suggestions.  Shelia MediaJones, Shaneice Barsanti, RN 07/19/2016, 4:09 PM

## 2016-07-19 NOTE — ED Notes (Signed)
BEHAVIORAL HEALTH ROUNDING Patient sleeping: No. Patient alert and oriented: yes Behavior appropriate: Yes.  ; If no, describe:  Nutrition and fluids offered: yes Toileting and hygiene offered: Yes  Sitter present: q15 minute observations and security  monitoring Law enforcement present: Yes  [employer]  TV turned on after consult completed

## 2016-07-19 NOTE — ED Provider Notes (Addendum)
-----------------------------------------   11:29 AM on 07/19/2016 -----------------------------------------  Dr. Toni Amendlapacs has seen the patient and feel she is appropriate for inpatient treatment given her drug abuse and what appears to be persistent drug-induced psychosis.  He does not feel she needs to be under IVC and the patient voluntarily wants help.   ----------------------------------------- 7:36 AM on 07/19/2016 -----------------------------------------   Blood pressure (!) 98/48, pulse 60, temperature 98.2 F (36.8 C), temperature source Oral, resp. rate 18, height 5\' 7"  (1.702 m), weight 83.9 kg, last menstrual period 07/18/2016, SpO2 99 %.  The patient had no acute events since last update.  Calm and cooperative at this time.  Patient appears to be recovering from recreational drug use and needs more time to recover.  Anticipate discharge today if she is less somnolent, able to ambulate, and has no SI/HI.   Emily Roseory Shalinda Burkholder, MD 07/19/16 [phone]    Emily Roseory Nickayla Mcinnis, MD 07/19/16 1130

## 2016-07-19 NOTE — ED Notes (Signed)
Pt to transfer to behavioral holding unit - report given to Enyah Moman B RN  Pt [facility] hold in main ED [facility] - per MD and charge otherwise move to holding unit as MD ordered

## 2016-07-19 NOTE — ED Notes (Addendum)
BEHAVIORAL HEALTH ROUNDING Patient sleeping: Yes.   Patient alert and oriented: eyes closed  Appears to be asleep Behavior appropriate: Yes.  ; If no, describe:  Nutrition and fluids offered: Yes  Toileting and hygiene offered: sleeping Sitter present: q 15 minute observations and security monitoring Law enforcement present: yes  [employer] 

## 2016-07-19 NOTE — Consult Note (Signed)
[facility] Consult   Reason for Consult:  Consult for 36 year old woman referred here from the shelter for altered mental status Referring Physician:  Karma Greaser Patient Identification: Emily Hutchinson MRN:  [id number] Principal Diagnosis: Amphetamine and psychostimulant-induced psychosis with hallucinations [facility]) Diagnosis:   Patient Active Problem List   Diagnosis Date Noted  . Amphetamine and psychostimulant-induced psychosis with hallucinations ([place]) [F15.951] 07/19/2016  . Severe recurrent major depression without psychotic features ([place]) [F33.2] 07/19/2016  . 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 Cocaine abuse [F14.10] 07/19/2016  . Amphetamine abuse [F15.10] 07/19/2016    Total Time spent with patient: 1 hour  Subjective:   Emily Hutchinson is a 36 y.o. female patient admitted with "I haven't been right in a while".  HPI:  Patient interviewed. Chart reviewed. Labs reviewed. 37 year old woman was referred here from the homeless shelter. She tried to go stay there and they insisted that she looked like she was actively intoxicated or somehow in an overdose. Patient was insisting that she was not at that time but they wouldn't let her in to the shelter. Patient says that she hasn't slept well in days and has been sleeping erratically for at least weeks. She is eating poorly. Mood feels down and sad negative and anxious most of the time. Lots of negative thoughts about herself. Has suicidal ideation are equal and labile but has not acted on it because of her children. Patient also is reporting visual hallucinations. She says these happen on a daily basis and are extremely vivid where she can actually see full details of people and strange scenes around her. They have been going on for the last couple weeks at least and she thinks they are related to a episode of abuse of what she thinks is high-grade amphetamine a couple weeks ago. She has felt crazy ever since then and hasn't  been able to think clearly. Not currently on any medication or receiving any kind of mental health or psychiatric treatment. Last use of drugs she says was about a day and a half ago for shooting up heroin amphetamine and cocaine.  Medical history: No known medical history aside from the mental health and substance abuse issues.  Social history currently patient has no place to live. For week she has been "bouncing around". Tried to go stay at the shelter and was refused. She has 2 young children who live with their father in [place]. Patient is estranged from her family and doesn't have any family she thinks that will be of help to her. Hasn't been able to work in weeks.  Substance abuse history: Long history of abuse of multiple drugs including heroin cocaine and amphetamines. She says that she doesn't use heroin constantly and doesn't tend to have withdrawal symptoms from it. She denies having any major withdrawal symptoms right now from opiates. 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  Past Psychiatric History: Patient had a suicide attempt about 8 or 9 years ago. Mood is consistently depressed. Has passive suicidal ideation all the time. She remembers having been prescribed Zoloft years ago for depression but didn't think it was helpful and doesn't remember any other medicine she was prescribed. She did participate in a methadone clinic for a couple years but stopped it last summer.  Risk to Self: Suicidal Ideation: No Suicidal Intent: No Is patient at risk for suicide?: No Suicidal Plan?: No Access to Means: No What has been your use of drugs/alcohol within the  last 12 months?: use of cocaine, meth, and heroin How many times?: 1 Other Self Harm Risks: substance abuse Triggers for Past Attempts: Unknown Intentional Self Injurious Behavior: None Risk to Others: Homicidal Ideation: No Thoughts of Harm to Others: No Current Homicidal Intent:  No Current Homicidal Plan: No Access to Homicidal Means: No Identified Victim: None identified History of harm to others?: No Assessment of Violence: None Noted Violent Behavior Description: denied Does patient have access to weapons?: No Criminal Charges Pending?: No Does patient have a court date: No Prior Inpatient Therapy: Prior Inpatient Therapy: Yes Prior Therapy Dates: 2010 Prior Therapy Facilty/Provider(s): [facility] Reason for Treatment: Suicidal intent Prior Outpatient Therapy: Prior Outpatient Therapy: No Prior Therapy Dates: n/a Prior Therapy Facilty/Provider(s): n/a Reason for Treatment: n/a Does patient have an ACCT team?: No Does patient have Intensive In-House Services?  : No Does patient have [facility] services? : No Does patient have P4CC services?: No  Past Medical History:  Past Medical History:  Diagnosis Date  . IV drug user     Past Surgical History:  Procedure Laterality Date  . CESAREAN SECTION    . CHOLECYSTECTOMY     Family History: No family history on file. Family Psychiatric  History: She has a sister with severe OCD and at least 2 uncles with severe mental illness and one who committed suicide. Social History:  History  Alcohol Use No     History  Drug Use  . Types: Cocaine, IV, Methamphetamines    Comment: Heroin    Social History   Social History  . Marital status: Single    Spouse name: N/A  . Number of children: N/A  . Years of education: N/A   Social History Main Topics  . Smoking status: Current Every Day Smoker    Packs/day: 1.00    Types: Cigarettes  . Smokeless tobacco: Never Used  . Alcohol use No  . Drug use: Yes    Types: Cocaine, IV, Methamphetamines     Comment: Heroin  . Sexual activity: Not Asked     Comment: methadone   Other Topics Concern  . None   Social History Narrative  . None   Additional Social History:    Allergies:   Allergies  Allergen Reactions  . Adhesive [Tape]     Labs:  Results for  orders placed or performed during the hospital encounter of 07/18/16 (from the past 48 hour(s))  Urine Drug Screen, Qualitative ([place] only)     Status: Abnormal   Collection Time: 07/18/16 12:33 PM  Result Value Ref Range   Tricyclic, Ur Screen NONE DETECTED NONE DETECTED   Amphetamines, Ur Screen POSITIVE (A) NONE DETECTED   MDMA (Ecstasy)Ur Screen NONE DETECTED NONE DETECTED   Cocaine Metabolite,Ur [place] POSITIVE (A) NONE DETECTED   Opiate, Ur Screen POSITIVE (A) NONE DETECTED   Phencyclidine (PCP) Ur S NONE DETECTED NONE DETECTED   Cannabinoid 50 Ng, Ur [place] NONE DETECTED NONE DETECTED   Barbiturates, Ur Screen NONE DETECTED NONE DETECTED   Benzodiazepine, Ur Scrn NONE DETECTED NONE DETECTED   Methadone Scn, Ur NONE DETECTED NONE DETECTED    Comment: (NOTE) 295  Tricyclics, urine               Cutoff 1000 ng/mL 200  Amphetamines, urine             Cutoff 1000 ng/mL 300  MDMA (Ecstasy), urine           Cutoff 500 ng/mL 400  Cocaine Metabolite,  urine       Cutoff 300 ng/mL 500  Opiate, urine                   Cutoff 300 ng/mL 600  Phencyclidine (PCP), urine      Cutoff 25 ng/mL 700  Cannabinoid, urine              Cutoff 50 ng/mL 800  Barbiturates, urine             Cutoff 200 ng/mL 900  Benzodiazepine, urine           Cutoff 200 ng/mL 1000 Methadone, urine                Cutoff 300 ng/mL 1100 1200 The urine drug screen provides only a preliminary, unconfirmed 1300 analytical test result and should not be used for non-medical 1400 purposes. Clinical consideration and professional judgment should 1500 be applied to any positive drug screen result due to possible 1600 interfering substances. A more specific alternate chemical method 1700 must be used in order to obtain a confirmed analytical result.  1800 Gas chromato graphy / mass spectrometry (GC/MS) is the preferred 1900 confirmatory method.   Urinalysis, Complete w Microscopic     Status: Abnormal   Collection Time: 07/18/16 12:33  PM  Result Value Ref Range   Color, Urine AMBER (A) YELLOW    Comment: BIOCHEMICALS MAY BE AFFECTED BY COLOR   APPearance CLEAR (A) CLEAR   Specific Gravity, Urine 1.025 1.005 - 1.030   pH 5.0 5.0 - 8.0   Glucose, UA NEGATIVE NEGATIVE mg/dL   Hgb urine dipstick MODERATE (A) NEGATIVE   Bilirubin Urine NEGATIVE NEGATIVE   Ketones, ur 5 (A) NEGATIVE mg/dL   Protein, ur 30 (A) NEGATIVE mg/dL   Nitrite POSITIVE (A) NEGATIVE   Leukocytes, UA SMALL (A) NEGATIVE   RBC / HPF 0-5 0 - 5 RBC/hpf   WBC, UA 6-30 0 - 5 WBC/hpf   Bacteria, UA MANY (A) NONE SEEN   Squamous Epithelial / LPF 0-5 (A) NONE SEEN   Mucous PRESENT   Pregnancy, urine POC     Status: None   Collection Time: 07/18/16 12:59 PM  Result Value Ref Range   Preg Test, Ur NEGATIVE NEGATIVE    Comment:        THE SENSITIVITY OF THIS METHODOLOGY IS >24 mIU/mL   CBC with Differential/Platelet     Status: None   Collection Time: 07/18/16  1:42 PM  Result Value Ref Range   WBC 7.9 3.6 - 11.0 K/uL   RBC 3.92 3.80 - 5.20 MIL/uL   Hemoglobin 12.5 12.0 - 16.0 g/dL   HCT 35.0 35.0 - 47.0 %   MCV 89.5 80.0 - 100.0 fL   MCH 32.0 26.0 - 34.0 pg   MCHC 35.8 32.0 - 36.0 g/dL   RDW 13.0 11.5 - 14.5 %   Platelets 167 150 - 440 K/uL   Neutrophils Relative % 59 %   Neutro Abs 4.6 1.4 - 6.5 K/uL   Lymphocytes Relative 30 %   Lymphs Abs 2.4 1.0 - 3.6 K/uL   Monocytes Relative 9 %   Monocytes Absolute 0.7 0.2 - 0.9 K/uL   Eosinophils Relative 2 %   Eosinophils Absolute 0.2 0 - 0.7 K/uL   Basophils Relative 0 %   Basophils Absolute 0.0 0 - 0.1 K/uL  Comprehensive metabolic panel     Status: Abnormal   Collection Time: 07/18/16  1:42 PM  Result Value  Ref Range   Sodium 135 135 - 145 mmol/L   Potassium 3.3 (L) 3.5 - 5.1 mmol/L   Chloride 101 101 - 111 mmol/L   CO2 28 22 - 32 mmol/L   Glucose, Bld 99 65 - 99 mg/dL   BUN 14 6 - 20 mg/dL   Creatinine, Ser 0.54 0.44 - 1.00 mg/dL   Calcium 8.7 (L) 8.9 - 10.3 mg/dL   Total Protein 6.7  6.5 - 8.1 g/dL   Albumin 3.6 3.5 - 5.0 g/dL   AST 28 15 - 41 U/L   ALT 46 14 - 54 U/L   Alkaline Phosphatase 76 38 - 126 U/L   Total Bilirubin 0.9 0.3 - 1.2 mg/dL   GFR calc non Af Amer >60 >60 mL/min   GFR calc Af Amer >60 >60 mL/min    Comment: (NOTE) The eGFR has been calculated using the CKD EPI equation. This calculation has not been validated in all clinical situations. eGFR's persistently <60 mL/min signify possible Chronic Kidney Disease.    Anion gap 6 5 - 15  Lactic acid, plasma     Status: None   Collection Time: 07/18/16  1:42 PM  Result Value Ref Range   Lactic Acid, Venous 0.7 0.5 - 1.9 mmol/L  CK     Status: None   Collection Time: 07/18/16  1:42 PM  Result Value Ref Range   Total CK 88 38 - 234 U/L    Current Facility-Administered Medications  Medication Dose Route Frequency Provider Last Rate Last Dose  . cloNIDine (CATAPRES) tablet 0.1 mg  0.1 mg Oral Q6H PRN Gonzella Lex, MD      . nicotine (NICODERM CQ - dosed in mg/24 hours) patch 21 mg  21 mg Transdermal Daily Gonzella Lex, MD   21 mg at 07/19/16 1334  . ondansetron (ZOFRAN) tablet 4 mg  4 mg Oral Q8H PRN Gonzella Lex, MD       Current Outpatient Prescriptions  Medication Sig Dispense Refill  . cephALEXin (KEFLEX) 500 MG capsule Take 1 capsule (500 mg total) by mouth 4 (four) times daily. 28 capsule 0  . cloNIDine (CATAPRES) 0.1 MG tablet Take 1 tablet (0.1 mg total) by mouth 2 (two) times daily. 14 tablet 0  . promethazine (PHENERGAN) 12.5 MG tablet Take 1 tablet (12.5 mg total) by mouth every 6 (six) hours as needed for nausea or vomiting. 12 tablet 0    Musculoskeletal: Strength & Muscle Tone: decreased Gait & Station: normal Patient leans: N/A  Psychiatric Specialty Exam: Physical Exam  Nursing note and vitals reviewed. Constitutional: She appears well-developed and well-nourished.  HENT:  Head: Normocephalic and atraumatic.  Eyes: Conjunctivae are normal. Pupils are equal, round, and  reactive to light.  Neck: Normal range of motion.  Cardiovascular: Regular rhythm and normal heart sounds.   Respiratory: Effort normal. No respiratory distress.  GI: Soft.  Musculoskeletal: Normal range of motion.  Neurological: She is alert.  Skin: Skin is warm and dry.  Psychiatric: Her mood appears anxious. Her affect is blunt. Her speech is delayed. She is slowed, withdrawn and actively hallucinating. Thought content is paranoid. Cognition and memory are impaired. She expresses impulsivity. She exhibits a depressed mood. She expresses suicidal ideation. She expresses no suicidal plans.    Review of Systems  Constitutional: Positive for malaise/fatigue and weight loss.  HENT: Negative.   Eyes: Negative.   Respiratory: Negative.   Cardiovascular: Negative.   Gastrointestinal: Negative.   Musculoskeletal: Positive for myalgias.  Skin: Negative.   Neurological: Negative.   Psychiatric/Behavioral: Positive for depression, hallucinations, memory loss, substance abuse and suicidal ideas. The patient is nervous/anxious and has insomnia.     Blood pressure (!) 98/48, pulse 60, temperature 98.2 F (36.8 C), temperature source Oral, resp. rate 18, height 5' 7" (1.702 m), weight 83.9 kg (185 lb), last menstrual period 07/18/2016, SpO2 99 %.Body mass index is 28.98 kg/m.  General Appearance: Disheveled  Eye Contact:  Fair  Speech:  Slow  Volume:  Decreased  Mood:  Anxious, Depressed and Dysphoric  Affect:  Constricted  Thought Process:  Goal Directed  Orientation:  Full (Time, Place, and Person)  Thought Content:  Logical and Hallucinations: Visual  Suicidal Thoughts:  Yes.  without intent/plan  Homicidal Thoughts:  No  Memory:  Immediate;   Fair Recent;   Fair Remote;   Fair  Judgement:  Impaired  Insight:  Fair  Psychomotor Activity:  Decreased  Concentration:  Concentration: Fair  Recall:  [employer] of Knowledge:  Fair  Language:  Fair  Akathisia:  No  Handed:  Right  AIMS  (if indicated):     Assets:  Desire for Improvement  ADL's:  Impaired  Cognition:  Impaired,  Mild  Sleep:        Treatment Plan Summary: Daily contact with patient to assess and evaluate symptoms and progress in treatment, Medication management and Plan 36 year old woman who presents to the emergency room still looking very altered and like she is intoxicated or not able to think clearly even though it's been probably at least a day since she last had any drugs. Patient is describing symptoms of depression and anxiety and also new intense hallucinations for the last couple weeks. Chronic suicidal ideation with very few resources. Poor self-care. I think that under the circumstances the best thing to do would be to admit her to the hospital at least for stabilization and an attempt to treat the psychotic symptoms. I offered about opiate withdrawal and she says that she is really not having bad symptoms so we will not start any methadone or Subutex. Start low dose of Haldol. 15 minute checks. Full set of labs. Admission orders completed.  Disposition: Recommend psychiatric Inpatient admission when medically cleared. Supportive therapy provided about ongoing stressors.  Alethia Berthold, MD 07/19/2016 3:05 PM

## 2016-07-20 DIAGNOSIS — F15951 Other stimulant use, unspecified with stimulant-induced psychotic disorder with hallucinations: Principal | ICD-10-CM

## 2016-07-20 LAB — LIPID PANEL
CHOLESTEROL: 138 mg/dL (ref 0–200)
HDL: 40 mg/dL — ABNORMAL LOW (ref >40)
LDL Cholesterol: 82 mg/dL (ref 0–99)
TRIGLYCERIDES: 80 mg/dL (ref <150)
Total CHOL/HDL Ratio: 3.5 RATIO
VLDL: 16 mg/dL (ref 0–40)

## 2016-07-20 LAB — TSH: TSH: 0.565 u[IU]/mL (ref 0.350–4.500)

## 2016-07-20 MED ORDER — FOSFOMYCIN TROMETHAMINE 3 G PO PACK
3.0000 g | PACK | Freq: Once | ORAL | Status: AC
  Administered 2016-07-20: 3 g via ORAL
  Filled 2016-07-20: qty 3

## 2016-07-20 MED ORDER — FLUOXETINE HCL 20 MG PO CAPS
20.0000 mg | ORAL_CAPSULE | Freq: Every day | ORAL | Status: DC
  Administered 2016-07-20 – 2016-07-21 (×2): 20 mg via ORAL
  Filled 2016-07-20 (×2): qty 1

## 2016-07-20 MED ORDER — WHITE PETROLATUM GEL
Status: DC | PRN
  Filled 2016-07-20: qty 5

## 2016-07-20 NOTE — Progress Notes (Signed)
Presents with flat affect but brightens upon approach. Denies SI/HI/AVH. Medication and group compliant.  Wants to continue treatment for substances once discharged from here. Interacting appropriately.  Support and encouragement offered. Safety maintained.

## 2016-07-20 NOTE — [facility] Group Notes (Signed)
  [facility] LCSW Group Therapy Note  Date/Time:07/20/2016. 1pm````````````````````````````````````````````````````````````````````  Type of Therapy/Topic:  Group Therapy:  Emotion Regulation  Participation Level:  Active   Mood:Verbalizes physical tiredness from new medications.   Description of Group:    The purpose of this group is to assist patients in learning to regulate negative emotions and experience positive emotions. Patients will be guided to discuss ways in which they have been vulnerable to their negative emotions. These vulnerabilities will be juxtaposed with experiences of positive emotions or situations, and patients challenged to use positive emotions to combat negative ones. Special emphasis will be placed on coping with negative emotions in conflict situations, and patients will process healthy conflict resolution skills.  Therapeutic Goals: 1. Patient will identify two positive emotions or experiences to reflect on in order to balance out negative emotions:  2. Patient will label two or more emotions that they find the most difficult to experience:  3. Patient will be able to demonstrate positive conflict resolution skills through discussion or role plays:   Summary of Patient Progress:  Pt able to complete'' above therapeutic goals     Therapeutic Modalities:   Cognitive Behavioral Therapy Feelings Identification Dialectical Behavioral Therapy  Jake SharkSara Liliane Mallis, MSW, LCSW

## 2016-07-20 NOTE — Tx Team (Signed)
Interdisciplinary Treatment and Diagnostic Plan Update  07/20/2016 Time of Session: [address] MRN: [id number]  Principal Diagnosis: Amphetamine and psychostimulant-induced psychosis with hallucinations (HCC)  Secondary Diagnoses: Principal Problem:   Amphetamine and psychostimulant-induced psychosis with hallucinations (HCC) Active Problems:   Severe recurrent major depression without psychotic features (HCC)   Opioid use disorder, moderate, dependence (HCC)   Cocaine use disorder, moderate, dependence (HCC)   Stimulant use disorder   Tobacco use disorder   Current Medications:  Current Facility-Administered Medications  Medication Dose Route Frequency Provider Last Rate Last Dose  . acetaminophen (TYLENOL) tablet 650 mg  650 mg Oral Q6H PRN Audery AmelJohn T Clapacs, MD      . alum & mag hydroxide-simeth (MAALOX/MYLANTA) 200-200-20 MG/5ML suspension 30 mL  30 mL Oral Q4H PRN Audery AmelJohn T Clapacs, MD      . cloNIDine (CATAPRES) tablet 0.1 mg  0.1 mg Oral Q6H PRN Audery AmelJohn T Clapacs, MD   0.1 mg at 07/19/16 2150  . FLUoxetine (PROZAC) capsule 20 mg  20 mg Oral QHS Jolanta B Pucilowska, MD      . fosfomycin (MONUROL) packet 3 g  3 g Oral Once Jolanta B Pucilowska, MD      . haloperidol (HALDOL) tablet 0.5 mg  0.5 mg Oral BID Audery AmelJohn T Clapacs, MD   0.5 mg at 07/20/16 0825  . hydrOXYzine (ATARAX/VISTARIL) tablet 25 mg  25 mg Oral TID PRN Audery AmelJohn T Clapacs, MD   25 mg at 07/19/16 2150  . magnesium hydroxide (MILK OF MAGNESIA) suspension 30 mL  30 mL Oral Daily PRN Audery AmelJohn T Clapacs, MD      . nicotine (NICODERM CQ - dosed in mg/24 hours) patch 21 mg  21 mg Transdermal Daily Audery AmelJohn T Clapacs, MD   21 mg at 07/20/16 0825  . ondansetron (ZOFRAN) tablet 4 mg  4 mg Oral Q8H PRN Audery AmelJohn T Clapacs, MD      . traZODone (DESYREL) tablet 100 mg  100 mg Oral QHS PRN Audery AmelJohn T Clapacs, MD   100 mg at 07/19/16 2150   PTA Medications: Prescriptions Prior to Admission  Medication Sig Dispense Refill Last Dose  . cephALEXin  (KEFLEX) 500 MG capsule Take 1 capsule (500 mg total) by mouth 4 (four) times daily. 28 capsule 0   . cloNIDine (CATAPRES) 0.1 MG tablet Take 1 tablet (0.1 mg total) by mouth 2 (two) times daily. 14 tablet 0   . promethazine (PHENERGAN) 12.5 MG tablet Take 1 tablet (12.5 mg total) by mouth every 6 (six) hours as needed for nausea or vomiting. 12 tablet 0     Patient Stressors: Financial difficulties Health problems Marital or family conflict Occupational concerns Substance abuse  Patient Strengths: Ability for insight Average or above average intelligence Communication skills General fund of knowledge  Treatment Modalities: Medication Management, Group therapy, Case management,  1 to 1 session with clinician, Psychoeducation, Recreational therapy.   Physician Treatment Plan for Primary Diagnosis: Amphetamine and psychostimulant-induced psychosis with hallucinations (HCC) Long Term Goal(s): Improvement in symptoms so as ready for discharge Improvement in symptoms so as ready for discharge   Short Term Goals: Ability to identify changes in lifestyle to reduce recurrence of condition will improve Ability to verbalize feelings will improve Ability to disclose and discuss suicidal ideas Ability to demonstrate self-control will improve Ability to identify and develop effective coping behaviors will improve Ability to maintain clinical measurements within normal limits will improve Compliance with prescribed medications will improve Ability to identify triggers associated with substance  abuse/mental health issues will improve Ability to identify changes in lifestyle to reduce recurrence of condition will improve Ability to demonstrate self-control will improve Ability to identify triggers associated with substance abuse/mental health issues will improve  Medication Management: Evaluate patient's response, side effects, and tolerance of medication regimen.  Therapeutic Interventions: 1  to 1 sessions, Unit Group sessions and Medication administration.  Evaluation of Outcomes: Progressing  Physician Treatment Plan for Secondary Diagnosis: Principal Problem:   Amphetamine and psychostimulant-induced psychosis with hallucinations (HCC) Active Problems:   Severe recurrent major depression without psychotic features (HCC)   Opioid use disorder, moderate, dependence (HCC)   Cocaine use disorder, moderate, dependence (HCC)   Stimulant use disorder   Tobacco use disorder  Long Term Goal(s): Improvement in symptoms so as ready for discharge Improvement in symptoms so as ready for discharge   Short Term Goals: Ability to identify changes in lifestyle to reduce recurrence of condition will improve Ability to verbalize feelings will improve Ability to disclose and discuss suicidal ideas Ability to demonstrate self-control will improve Ability to identify and develop effective coping behaviors will improve Ability to maintain clinical measurements within normal limits will improve Compliance with prescribed medications will improve Ability to identify triggers associated with substance abuse/mental health issues will improve Ability to identify changes in lifestyle to reduce recurrence of condition will improve Ability to demonstrate self-control will improve Ability to identify triggers associated with substance abuse/mental health issues will improve     Medication Management: Evaluate patient's response, side effects, and tolerance of medication regimen.  Therapeutic Interventions: 1 to 1 sessions, Unit Group sessions and Medication administration.  Evaluation of Outcomes: Progressing   RN Treatment Plan for Primary Diagnosis: Amphetamine and psychostimulant-induced psychosis with hallucinations (HCC) Long Term Goal(s): Knowledge of disease and therapeutic regimen to maintain health will improve  Short Term Goals: Ability to verbalize feelings will improve, Ability to  identify and develop effective coping behaviors will improve and Compliance with prescribed medications will improve  Medication Management: RN will administer medications as ordered by provider, will assess and evaluate patient's response and provide education to patient for prescribed medication. RN will report any adverse and/or side effects to prescribing provider.  Therapeutic Interventions: 1 on 1 counseling sessions, Psychoeducation, Medication administration, Evaluate responses to treatment, Monitor vital signs and CBGs as ordered, Perform/monitor CIWA, COWS, AIMS and Fall Risk screenings as ordered, Perform wound care treatments as ordered.  Evaluation of Outcomes: Progressing   LCSW Treatment Plan for Primary Diagnosis: Amphetamine and psychostimulant-induced psychosis with hallucinations (HCC) Long Term Goal(s): Safe transition to appropriate next level of care at discharge, Engage patient in therapeutic group addressing interpersonal concerns.  Short Term Goals: Engage patient in aftercare planning with referrals and resources, Increase social support and Increase skills for wellness and recovery  Therapeutic Interventions: Assess for all discharge needs, 1 to 1 time with Social worker, Explore available resources and support systems, Assess for adequacy in community support network, Educate family and significant other(s) on suicide prevention, Complete Psychosocial Assessment, Interpersonal group therapy.  Evaluation of Outcomes: Progressing   Progress in Treatment: Attending groups: Yes. Participating in groups: Yes. Taking medication as prescribed: Yes. Toleration medication: Yes. Family/Significant other contact made: No, will contact:  boyfriend Patient understands diagnosis: Yes. Discussing patient identified problems/goals with staff: Yes. Medical problems stabilized or resolved: Yes. Denies suicidal/homicidal ideation: Yes. Issues/concerns per patient self-inventory:  No. Other: none  New problem(s) identified: No, Describe:  none  New Short Term/Long Term Goal(s):PT goal is  to secure residential treatment to address her substance use problems.  Discharge Plan or Barriers: Residential substance abuse treatment.  Reason for Continuation of Hospitalization: Depression Medication stabilization  Estimated Length of Stay: 1-3 days.  Attendees: Patient: Emily Hutchinson 07/20/2016   Physician: Dr. Jennet Maduro, MD 07/20/2016   Nursing: Leonia Reader, RN 07/20/2016   RN Care Manager: 07/20/2016   Social Worker: Daleen Squibb, LCSW 07/20/2016   Recreational Therapist: Hershal Coria, LRT/CTRS  07/20/2016   Other:  07/20/2016   Other:  07/20/2016   Other: 07/20/2016        Scribe for Treatment Team: Lorri Frederick, LCSW 07/20/2016 4:17 PM

## 2016-07-20 NOTE — Progress Notes (Signed)
Recreation Therapy Notes  Date: 02.28.18 Time: 9:30 am Location: Craft Room  Group Topic: Self-esteem  Goal Area(s) Addresses:  Patient will identify at least one positive trait about self. Patient will identify at least one healthy coping skill.  Behavioral Response: Did not attend  Intervention: All About Me  Activity: Patients were instructed to make an All About Me pamphlet including their life's motto, positive traits, healthy coping skills, and their support system.  Education: LRT educated patients on ways they can increase their self-esteem.  Education Outcome: Patient did not attend group.  Clinical Observations/Feedback: Patient invited, but did not attend group.  Jacquelynn CreeGreene,Lizzette Carbonell M, LRT/CTRS 07/20/2016 10:01 AM

## 2016-07-20 NOTE — Plan of Care (Signed)
Problem: Coping: Goal: Ability to verbalize frustrations and anger appropriately will improve Outcome: Progressing Patient verbalized feelings to staff.    

## 2016-07-20 NOTE — H&P (Signed)
Psychiatric Admission Assessment Adult  Patient Identification: Emily Hutchinson MRN:  [id number] Date of Evaluation:  07/20/2016 Chief Complaint:  Phenomena psychosis Principal Diagnosis: Amphetamine and psychostimulant-induced psychosis with hallucinations (HCC) Diagnosis:   Patient Active Problem List   Diagnosis Date Noted  . 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 Severe recurrent major depression without psychotic features (HCC) [F33.2] 07/19/2016  . Opioid use disorder, moderate, dependence (HCC) [F11.20] 07/19/2016  . Cocaine use disorder, moderate, dependence (HCC) [F14.20] 07/19/2016  . 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 Tobacco use disorder [F17.200] 07/19/2016   History of Present Illness:   Identifying data. [name] is a 36 year old female with history of depression and substance abuse.  Chief complaint. "Drugs."  History of present illness. Information was obtained from the patient and the chart. The patient was brought to the emergency room by the police. Per her own report she was staying at the hotel with someone. She had a dose of heroine at noon. She was then walking towards the [place] but was taken by police. Behavior. She was taken to the homeless shelter at the refused to take her in thinking that she was on drugs. The police took her to the emergency room. The patient was somewhat disorganized, hallucinating, and suicidal. She was admitted to the hospital for further treatment. She has a long history of substance abuse and some depression. She is ready to start residential substance abuse treatment program. She called [facility] in [place] already. It is unclear if patient can be accepted to this program as it is limited to the [place]. The patient reports poor sleep, decreased appetite, anhedonia, feeling of guilt and hopelessness worthlessness, poor energy  and concentration, social isolation, crying spells, frequent suicidal thoughts. For the past month she has been increasingly confused and hallucinating frequently. She believes that this is related to her being amphetamine binge a month ago. She believes that there was some permanent damage done to her brain and she has never been the same. She reports heightened anxiety especially when she cannot find any drugs. She denies alcohol use. She has been using heroine, cocaine, methamphetamines, "you name it."  Past psychiatry history. She was hospitalized one time before after suicide attempt several years ago. She cut her wrist and took pills. She has been in detox several times. Couple years ago she went to a rehabilitation program in [place] where she stayed for 3 months. She was able to maintain sobriety for one month. A year or so ago she was still in methadone clinic.  Family psychiatric history. Multiple family members with depression and anxiety and substance abuse.  Social history. She is homeless and without resources. There is absolutely no support.  Total Time spent with patient: 1 hour  Past Psychiatric History: depression, substance abuse.  Is the patient at risk to self? Yes.    Has the patient been a risk to self in the past 6 months? No.  Has the patient been a risk to self within the distant past? Yes.    Is the patient a risk to others? No.  Has the patient been a risk to others in the past 6 months? No.  Has the patient been a risk to others within the distant past? No.   Prior Inpatient Therapy:   Prior Outpatient Therapy:    Alcohol Screening: 1. How often do you have a drink containing alcohol?: Never 9. Have you or someone  else been injured as a result of your drinking?: No 10. Has a relative or friend or a doctor or another health worker been concerned about your drinking or suggested you cut down?: No Alcohol Use Disorder Identification Test Final Score (AUDIT):  0 Brief Intervention: AUDIT score less than 7 or less-screening does not suggest unhealthy drinking-brief intervention not indicated Substance Abuse History in the last 12 months:  Yes.   Consequences of Substance Abuse: Negative Previous Psychotropic Medications: Yes  Psychological Evaluations: No  Past Medical History:  Past Medical History:  Diagnosis Date  . IV drug user     Past Surgical History:  Procedure Laterality Date  . CESAREAN SECTION    . CHOLECYSTECTOMY     Family History: History reviewed. No pertinent family history.  Tobacco Screening: Have you used any form of tobacco in the last 30 days? (Cigarettes, Smokeless Tobacco, Cigars, and/or Pipes): Yes Tobacco use, Select all that apply: 5 or more cigarettes per day Are you interested in Tobacco Cessation Medications?: Yes, will notify MD for an order Counseled patient on smoking cessation including recognizing danger situations, developing coping skills and basic information about quitting provided: Refused/Declined practical counseling Social History:  History  Alcohol Use No     History  Drug Use  . Types: Cocaine, IV, Methamphetamines    Comment: Heroin    Additional Social History:                           Allergies:   Allergies  Allergen Reactions  . Adhesive [Tape]    Lab Results:  Results for orders placed or performed during the hospital encounter of 07/19/16 (from the past 48 hour(s))  Lipid panel     Status: Abnormal   Collection Time: 07/20/16  6:57 AM  Result Value Ref Range   Cholesterol 138 0 - 200 mg/dL   Triglycerides 80 <161 mg/dL   HDL 40 (L) >09 mg/dL   Total CHOL/HDL Ratio 3.5 RATIO   VLDL 16 0 - 40 mg/dL   LDL Cholesterol 82 0 - 99 mg/dL    Comment:        Total Cholesterol/HDL:CHD Risk Coronary Heart Disease Risk Table                     Men   Women  1/2 Average Risk   3.4   3.3  Average Risk       5.0   4.4  2 X Average Risk   9.6   7.1  3 X Average Risk  23.4    11.0        Use the calculated Patient Ratio above and the CHD Risk Table to determine the patient's CHD Risk.        ATP III CLASSIFICATION (LDL):  <100     mg/dL   Optimal  604-540  mg/dL   Near or Above                    Optimal  130-159  mg/dL   Borderline  981-191  mg/dL   High  >478     mg/dL   Very High   TSH     Status: None   Collection Time: 07/20/16  6:57 AM  Result Value Ref Range   TSH 0.565 0.350 - 4.500 uIU/mL    Comment: Performed by a 3rd Generation assay with a functional sensitivity of <=0.01 uIU/mL.  Blood Alcohol level:  No results found for: [facility]  Metabolic Disorder Labs:  No results found for: HGBA1C, MPG No results found for: PROLACTIN Lab Results  Component Value Date   CHOL 138 07/20/2016   TRIG 80 07/20/2016   HDL 40 (L) 07/20/2016   CHOLHDL 3.5 07/20/2016   VLDL 16 07/20/2016   LDLCALC 82 07/20/2016    Current Medications: Current Facility-Administered Medications  Medication Dose Route Frequency Provider Last Rate Last Dose  . acetaminophen (TYLENOL) tablet 650 mg  650 mg Oral Q6H PRN Audery Amel, MD      . alum & mag hydroxide-simeth (MAALOX/MYLANTA) 200-200-20 MG/5ML suspension 30 mL  30 mL Oral Q4H PRN Audery Amel, MD      . cloNIDine (CATAPRES) tablet 0.1 mg  0.1 mg Oral Q6H PRN Audery Amel, MD   0.1 mg at 07/19/16 2150  . haloperidol (HALDOL) tablet 0.5 mg  0.5 mg Oral BID Audery Amel, MD   0.5 mg at 07/20/16 0825  . hydrOXYzine (ATARAX/VISTARIL) tablet 25 mg  25 mg Oral TID PRN Audery Amel, MD   25 mg at 07/19/16 2150  . magnesium hydroxide (MILK OF MAGNESIA) suspension 30 mL  30 mL Oral Daily PRN Audery Amel, MD      . nicotine (NICODERM CQ - dosed in mg/24 hours) patch 21 mg  21 mg Transdermal Daily Audery Amel, MD   21 mg at 07/20/16 0825  . ondansetron (ZOFRAN) tablet 4 mg  4 mg Oral Q8H PRN Audery Amel, MD      . traZODone (DESYREL) tablet 100 mg  100 mg Oral QHS PRN Audery Amel, MD   100 mg at  07/19/16 2150   PTA Medications: Prescriptions Prior to Admission  Medication Sig Dispense Refill Last Dose  . cephALEXin (KEFLEX) 500 MG capsule Take 1 capsule (500 mg total) by mouth 4 (four) times daily. 28 capsule 0   . cloNIDine (CATAPRES) 0.1 MG tablet Take 1 tablet (0.1 mg total) by mouth 2 (two) times daily. 14 tablet 0   . promethazine (PHENERGAN) 12.5 MG tablet Take 1 tablet (12.5 mg total) by mouth every 6 (six) hours as needed for nausea or vomiting. 12 tablet 0     Musculoskeletal: Strength & Muscle Tone: within normal limits Gait & Station: normal Patient leans: N/A  Psychiatric Specialty Exam: Physical Exam  Nursing note and vitals reviewed. Constitutional: She is oriented to person, place, and time. She appears well-developed and well-nourished.  HENT:  Head: Normocephalic and atraumatic.  Eyes: Conjunctivae and EOM are normal. Pupils are equal, round, and reactive to light.  Neck: Normal range of motion. Neck supple.  Cardiovascular: Normal rate, regular rhythm and normal heart sounds.   Respiratory: Effort normal and breath sounds normal.  GI: Soft. Bowel sounds are normal.  Musculoskeletal: Normal range of motion.  Neurological: She is alert and oriented to person, place, and time.  Skin: Skin is warm and dry.  Psychiatric: Her speech is normal. She is withdrawn. Cognition and memory are normal. She expresses impulsivity. She exhibits a depressed mood. She expresses suicidal ideation. She expresses suicidal plans.    ROS  Blood pressure 121/73, pulse 62, temperature 97.8 F (36.6 C), resp. rate 18, height 5\' 7"  (1.702 m), weight 88 kg (194 lb), last menstrual period 07/18/2016, SpO2 99 %.Body mass index is 30.38 kg/m.  See SRA.  Sleep:  Number of Hours: 7.25    Treatment Plan Summary: Daily contact with patient to assess and evaluate symptoms and progress in treatment and Medication  management   Ms. Stammen is a 36 year old female with a history of depression and substance abuse admitted for auditory and visual hallucinations and suicidal ideation in the context of substance abuse and severe social stressors.  1. Suicidal ideation. The patient is able to contract for safety in the hospital.  2. Depression/Psychosis. She was started on low-dose Haldol in the emergency room. She is ready to try an antidepressant will give Prozac.  3. Heroine abuse. As last dose was yesterday at noon. She denies recent heavy use. Clonidine is available. She is not a drinker.  4. Substance abuse treatment. The patient desires residential treatment. She called [facility] already. Social worker will investigate.  5. Anxiety. Vistaril is available.  6. Insomnia. Trazodone is available.  7. Smoking. Nicotine patch is available.  8. UTI. We will give fosfomycin.  9. Metabolic syndrome monitoring. Lipid panel, TSH, hemoglobin A1c are pending.  10. EKG. Sinus rhythm. QTc 429.  11. Head CT scan performed in the emergency room was negative.  12. Disposition. To be established.   Observation Level/Precautions:  15 minute checks  Laboratory:  CBC Chemistry Profile UDS UA  Psychotherapy:    Medications:    Consultations:    Discharge Concerns:    Estimated LOS:  Other:     Physician Treatment Plan for Primary Diagnosis: Amphetamine and psychostimulant-induced psychosis with hallucinations (HCC) Long Term Goal(s): Improvement in symptoms so as ready for discharge  Short Term Goals: Ability to identify changes in lifestyle to reduce recurrence of condition will improve, Ability to verbalize feelings will improve, Ability to disclose and discuss suicidal ideas, Ability to demonstrate self-control will improve, Ability to identify and develop effective coping behaviors will improve, Ability to maintain clinical measurements within normal limits will improve, Compliance with prescribed  medications will improve and Ability to identify triggers associated with substance abuse/mental health issues will improve  Physician Treatment Plan for Secondary Diagnosis: Principal Problem:   Amphetamine and psychostimulant-induced psychosis with hallucinations (HCC) Active Problems:   Severe recurrent major depression without psychotic features (HCC)   Opioid use disorder, moderate, dependence (HCC)   Cocaine use disorder, moderate, dependence (HCC)   Stimulant use disorder   Tobacco use disorder  Long Term Goal(s): Improvement in symptoms so as ready for discharge  Short Term Goals: Ability to identify changes in lifestyle to reduce recurrence of condition will improve, Ability to demonstrate self-control will improve and Ability to identify triggers associated with substance abuse/mental health issues will improve  I certify that inpatient services furnished can reasonably be expected to improve the patient's condition.    Kristine Linea, MD 2/28/20188:47 AM

## 2016-07-20 NOTE — Progress Notes (Signed)
D: Pt denies SI/HI/AVH, no bizarre behavior or psychosis noted. Patient's affect is flat but brightens upon approach. Patient is pleasant and cooperative with care. Pt appears not anxious, has some insight into her polysubstance abuse, and expressed her feelings of guilt. Patient is interacting with peers and staff appropriately.  A: Pt was offered support and encouragement. Pt was given scheduled medications. Pt was encouraged to attend groups. Q 15 minute checks were done for safety.  R:Pt attends groups and interacts well with peers and staff. Pt is taking medication. Pt has no complaints.Pt receptive to treatment and safety maintained on unit.

## 2016-07-20 NOTE — [facility] Group Notes (Signed)
[facility] Group Notes:  (Nursing/MHT/Case Management/Adjunct)  Date:  07/20/2016  Time:  5:11 PM  Type of Therapy:  Psychoeducational Skills  Participation Level:  Did Not Attend  Twanna Hymanda C Kevonna Nolte 07/20/2016, 5:11 PM

## 2016-07-20 NOTE — [facility] Suicide Risk Assessment (Signed)
[facility]   Nursing information obtained from:  Patient Demographic factors:  Caucasian, Low socioeconomic status, Unemployed Current Mental Status:  NA Loss Factors:  NA Historical Factors:  NA Risk Reduction Factors:  NA  Total Time spent with patient: 1 hour Principal Problem: Amphetamine and psychostimulant-induced psychosis with hallucinations (HCC) Diagnosis:   Patient Active Problem List   Diagnosis Date Noted  . 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 Severe recurrent major depression without psychotic features (HCC) [F33.2] 07/19/2016  . Opioid use disorder, moderate, dependence (HCC) [F11.20] 07/19/2016  . Cocaine use disorder, moderate, dependence (HCC) [F14.20] 07/19/2016  . 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 Tobacco use disorder [F17.200] 07/19/2016   Subjective Data: psychosis, substance abuse, suicidal ideation.  Continued Clinical Symptoms:  Alcohol Use Disorder Identification Test Final Score (AUDIT): 0 The "Alcohol Use Disorders Identification Test", Guidelines for Use in Primary Care, Second Edition.  World Science writerHealth Organization [facility](WHO). Score between 0-7:  no or low risk or alcohol related problems. Score between 8-15:  moderate risk of alcohol related problems. Score between 16-19:  high risk of alcohol related problems. Score 20 or above:  warrants further diagnostic evaluation for alcohol dependence and treatment.   CLINICAL FACTORS:   Depression:   Comorbid alcohol abuse/dependence Impulsivity Alcohol/Substance Abuse/Dependencies   Musculoskeletal: Strength & Muscle Tone: within normal limits Gait & Station: normal Patient leans: N/A  Psychiatric Specialty Exam: Physical Exam  Nursing note and vitals reviewed. Psychiatric: Her speech is normal. She is withdrawn and actively hallucinating. Cognition and memory are normal. She expresses impulsivity. She  exhibits a depressed mood. She expresses suicidal ideation. She expresses suicidal plans.    Review of Systems  Psychiatric/Behavioral: Positive for depression, hallucinations, substance abuse and suicidal ideas.  All other systems reviewed and are negative.   Blood pressure 121/73, pulse 62, temperature 97.8 F (36.6 C), resp. rate 18, height 5\' 7"  (1.702 m), weight 88 kg (194 lb), last menstrual period 07/18/2016, SpO2 99 %.Body mass index is 30.38 kg/m.  General Appearance: Casual  Eye Contact:  Good  Speech:  Clear and Coherent  Volume:  Normal  Mood:  Depressed  Affect:  Congruent  Thought Process:  Goal Directed and Descriptions of Associations: Intact  Orientation:  Full (Time, Place, and Person)  Thought Content:  Hallucinations: Auditory Visual  Suicidal Thoughts:  Yes.  with intent/plan  Homicidal Thoughts:  No  Memory:  Immediate;   Fair Recent;   Fair Remote;   Fair  Judgement:  Poor  Insight:  Lacking  Psychomotor Activity:  Normal  Concentration:  Concentration: Fair and Attention Span: Fair  Recall:  [employer]Fair  Fund of Knowledge:  Fair  Language:  Fair  Akathisia:  No  Handed:  Right  AIMS (if indicated):     Assets:  Communication Skills Desire for Improvement Physical Health Resilience  ADL's:  Intact  Cognition:  WNL  Sleep:  Number of Hours: 7.25      COGNITIVE FEATURES THAT CONTRIBUTE TO RISK:  None    SUICIDE RISK:   Moderate:  Frequent suicidal ideation with limited intensity, and duration, some specificity in terms of plans, no associated intent, good self-control, limited dysphoria/symptomatology, some risk factors present, and identifiable protective factors, including available and accessible social support.  PLAN OF CARE: Hospital admission, medication management, substance abuse counseling, discharge planning.  Emily Hutchinson is a 36 year old female with a history of depression and substance abuse admitted for auditory and visual  hallucinations  and suicidal ideation in the context of substance abuse and severe social stressors.  1. Suicidal ideation. The patient is able to contract for safety in the hospital.  2. Depression/Psychosis. She was started on low-dose Haldol in the emergency room. She is ready to try an antidepressant will give Prozac.  3. Heroine abuse. As last dose was yesterday at noon. She denies recent heavy use. Clonidine is available. She is not a drinker.  4. Substance abuse treatment. The patient desires residential treatment. She called [facility] already. Social worker will investigate.  5. Anxiety. Vistaril is available.  6. Insomnia. Trazodone is available.  7. Smoking. Nicotine patch is available.  8. UTI. We will give fosfomycin.  9. Metabolic syndrome monitoring. Lipid panel, TSH, hemoglobin A1c are pending.  10. EKG. Sinus rhythm. QTc 429.  11. Head CT scan performed in the emergency room was negative.  12. Disposition. To be established.     I certify that inpatient services furnished can reasonably be expected to improve the patient's condition.   Kristine Linea, MD 07/20/2016, 8:37 AM

## 2016-07-21 LAB — HEMOGLOBIN A1C
HEMOGLOBIN A1C: 4.9 % (ref 4.8–5.6)
MEAN PLASMA GLUCOSE: 94 mg/dL

## 2016-07-21 NOTE — Progress Notes (Signed)
Pt awake, alert, oriented and up on unit today. Pt noted to be in bed most of the day except during meal and med times. Isolative to room, minimal interaction noted. Brightens on approach. Appears depressed, sad, worried. Denies SI/HI/AVH. Voices desire for substance abuse treatment on discharge. Calm and cooperative. Medication compliant.   Support and encouragement provided. Medications administered as ordered with education. Safety maintained with every 15 minute checks. Will continue to monitor.

## 2016-07-21 NOTE — Progress Notes (Signed)
Recreation Therapy Notes  INPATIENT RECREATION THERAPY ASSESSMENT  Patient Details Name: Emily Hutchinson MRN: [id number] DOB: 1981/02/05 Today's Date: 07/21/2016  Patient Stressors: Friends, Work, Other (Comment) (Lack of supportive friends; looking for work; living situation - bouncing around)  Coping Skills:   Isolate, Arguments, Substance Abuse, Avoidance, Art/Dance, Music, Sports  Personal Challenges: Anger, Concentration, Relationships, Self-Esteem/Confidence, Stress Management, Substance Abuse, Trusting Others  Leisure Interests (2+):  Individual - Other (Comment) (Watch movies, shoot pool)  Awareness of Community Resources:  Yes  Community Resources:  [place], Movie Theaters  Current Use: No  If no, Barriers?: Financial  Patient Strengths:  Not now  Patient Identified Areas of Improvement:  Drugs  Current Recreation Participation:  Drugs  Patient Goal for Hospitalization:  To get off drugs  [place]  [place] Residence:  [place]   Current SI (including self-harm):  No  Current HI:  No  Consent to Intern Participation: N/A   Jacquelynn CreeGreene,Ollen Rao M, LRT/CTRS 07/21/2016, 4:37 PM

## 2016-07-21 NOTE — [facility] Group Notes (Signed)
[facility] LCSW Group Therapy Note  Type of Therapy and Topic:  Group Therapy:  Goals Group: SMART Goals  Participation Level:  Patient did not attend group. CSW invited patient to group.   Description of Group:   The purpose of a daily goals group is to assist and guide patients in setting recovery/wellness-related goals.  The objective is to set goals as they relate to the crisis in which they were admitted. Patients will be using SMART goal modalities to set measurable goals.  Characteristics of realistic goals will be discussed and patients will be assisted in setting and processing how one will reach their goal. Facilitator will also assist patients in applying interventions and coping skills learned in psycho-education groups to the SMART goal and process how one will achieve defined goal.  Therapeutic Goals: -Patients will develop and document one goal related to or their crisis in which brought them into treatment. -Patients will be guided by LCSW using SMART goal setting modality in how to set a measurable, attainable, realistic and time sensitive goal.  -Patients will process barriers in reaching goal. -Patients will process interventions in how to overcome and successful in reaching goal.   Summary of Patient Progress:  Patient Goal: Patient did not attend group. CSW invited patient to group.    Therapeutic Modalities:   Motivational Interviewing Cognitive Behavioral Therapy Crisis Intervention Model SMART goals setting  Benedicto Capozzi G. Terricka Onofrio MSW, LCSWA 07/21/2016 11:15 AM 

## 2016-07-21 NOTE — [facility] Group Notes (Signed)
[facility] LCSW Group Therapy  07/21/2016 1:58 PM  Type of Therapy:  Group Therapy  Participation Level:  Patient did not attend group. CSW invited patient to group.   Summary of Progress/Problems: Balance in life: Patients will discuss the concept of balance and how it looks and feels to be unbalanced. Pt will identify areas in their life that is unbalanced and ways to become more balanced. They discussed what aspects in their lives has influenced their self care. Patients also discussed self care in the areas of self regulation/control, hygiene/appearance, sleep/relaxation, healthy leisure, healthy eating habits, exercise, inner peace/spirituality, self improvement, sobriety, and health management. They were challenged to identify changes that are needed in order to improve self care.  Jami Ohlin G. Garnette CzechSampson MSW, LCSWA 07/21/2016, 1:59 PM

## 2016-07-21 NOTE — [facility] Suicide Risk Assessment (Addendum)
[facility] INPATIENT:  Family/Significant Other Suicide Prevention Education  Suicide Prevention Education:  Contact Attempts:  Emily DoppDavid Hutchinson, boyfriend, [phone],has been identified by the patient as the family member/significant other with whom the patient will be residing, and identified as the person(s) who will aid the patient in the event of a mental health crisis.  With written consent from the patient, two attempts were made to provide suicide prevention education, prior to and/or following the patient's discharge.  We were unsuccessful in providing suicide prevention education.  A suicide education pamphlet was given to the patient to share with family/significant other.  Date and time of first attempt:07/21/16, 1510 Date and time of second attempt: 07/22/16 1010    Emily Hutchinson, Emily Uppal Jon,LCSW 07/21/2016, 3:11 PM

## 2016-07-21 NOTE — Progress Notes (Signed)
[facility] MD Progress Note  07/21/2016 12:02 PM Emily Hutchinson  MRN:  [id number]  Subjective:   07/21/2016. Emily Hutchinson feels tired, depressed, and suicidal. She has been very worried about disposition. She is homeless. She desires residential substance abuse treatment program. We are looking into it. She tolerates medications well. There are no other somatic complaints. She does not participate in programming.  Per nursing: Presents with flat affect but brightens upon approach. Denies SI/HI/AVH. Medication and group compliant.  Wants to continue treatment for substances once discharged from here. Interacting appropriately.  Support and encouragement offered. Safety maintained.   Principal Problem: Amphetamine and psychostimulant-induced psychosis with hallucinations (HCC) Diagnosis:   Patient Active Problem List   Diagnosis Date Noted  . 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 Severe recurrent major depression without psychotic features (HCC) [F33.2] 07/19/2016  . Opioid use disorder, moderate, dependence (HCC) [F11.20] 07/19/2016  . Cocaine use disorder, moderate, dependence (HCC) [F14.20] 07/19/2016  . 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 Tobacco use disorder [F17.200] 07/19/2016   Total Time spent with patient: 20 minutes  Past Psychiatric History: depression, substance use.  Past Medical History:  Past Medical History:  Diagnosis Date  . IV drug user     Past Surgical History:  Procedure Laterality Date  . CESAREAN SECTION    . CHOLECYSTECTOMY     Family History: History reviewed. No pertinent family history. Family Psychiatric  History: depression. Social History:  History  Alcohol Use No     History  Drug Use  . Types: Cocaine, IV, Methamphetamines    Comment: Heroin    Social History   Social History  . Marital status: Single    Spouse name: N/A  . Number of children: N/A  . Years of education: N/A    Social History Main Topics  . Smoking status: Current Every Day Smoker    Packs/day: 1.00    Types: Cigarettes  . Smokeless tobacco: Never Used  . Alcohol use No  . Drug use: Yes    Types: Cocaine, IV, Methamphetamines     Comment: Heroin  . Sexual activity: Not Asked     Comment: methadone   Other Topics Concern  . None   Social History Narrative  . None   Additional Social History:                         Sleep: Fair  Appetite:  Fair  Current Medications: Current Facility-Administered Medications  Medication Dose Route Frequency Provider Last Rate Last Dose  . acetaminophen (TYLENOL) tablet 650 mg  650 mg Oral Q6H PRN Audery Amel, MD      . alum & mag hydroxide-simeth (MAALOX/MYLANTA) 200-200-20 MG/5ML suspension 30 mL  30 mL Oral Q4H PRN Audery Amel, MD      . cloNIDine (CATAPRES) tablet 0.1 mg  0.1 mg Oral Q6H PRN Audery Amel, MD   0.1 mg at 07/20/16 2235  . FLUoxetine (PROZAC) capsule 20 mg  20 mg Oral QHS Jalayna Josten B Jaray Boliver, MD   20 mg at 07/20/16 2104  . haloperidol (HALDOL) tablet 0.5 mg  0.5 mg Oral BID Audery Amel, MD   0.5 mg at 07/21/16 0845  . hydrOXYzine (ATARAX/VISTARIL) tablet 25 mg  25 mg Oral TID PRN Audery Amel, MD   25 mg at 07/20/16 2106  . magnesium hydroxide (MILK OF MAGNESIA) suspension 30 mL  30 mL Oral Daily PRN Jonny Ruiz  T Clapacs, MD      . nicotine (NICODERM CQ - dosed in mg/24 hours) patch 21 mg  21 mg Transdermal Daily Audery AmelJohn T Clapacs, MD   21 mg at 07/21/16 0848  . ondansetron (ZOFRAN) tablet 4 mg  4 mg Oral Q8H PRN Audery AmelJohn T Clapacs, MD      . traZODone (DESYREL) tablet 100 mg  100 mg Oral QHS PRN Audery AmelJohn T Clapacs, MD   100 mg at 07/20/16 2106  . white petrolatum (VASELINE) gel   Topical PRN Shari ProwsJolanta B Triston Skare, MD        Lab Results:  Results for orders placed or performed during the hospital encounter of 07/19/16 (from the past 48 hour(s))  Hemoglobin A1c     Status: None   Collection Time: 07/20/16  6:57 AM  Result  Value Ref Range   Hgb A1c MFr Bld 4.9 4.8 - 5.6 %    Comment: (NOTE)         Pre-diabetes: 5.7 - 6.4         Diabetes: >6.4         Glycemic control for adults with diabetes: <7.0    Mean Plasma Glucose 94 mg/dL    Comment: (NOTE) Performed At: [facility] [address] [place], [place] [id number] Mila HomerHancock William F MD NW:[id number]:[phone]   Lipid panel     Status: Abnormal   Collection Time: 07/20/16  6:57 AM  Result Value Ref Range   Cholesterol 138 0 - 200 mg/dL   Triglycerides 80 <578<150 mg/dL   HDL 40 (L) >46>40 mg/dL   Total CHOL/HDL Ratio 3.5 RATIO   VLDL 16 0 - 40 mg/dL   LDL Cholesterol 82 0 - 99 mg/dL    Comment:        Total Cholesterol/HDL:CHD Risk Coronary Heart Disease Risk Table                     Men   Women  1/2 Average Risk   3.4   3.3  Average Risk       5.0   4.4  2 X Average Risk   9.6   7.1  3 X Average Risk  23.4   11.0        Use the calculated Patient Ratio above and the CHD Risk Table to determine the patient's CHD Risk.        ATP III CLASSIFICATION (LDL):  <100     mg/dL   Optimal  [id number]  mg/dL   Near or Above                    Optimal  130-159  mg/dL   Borderline  [id number]  mg/dL   High  >401>190     mg/dL   Very High   TSH     Status: None   Collection Time: 07/20/16  6:57 AM  Result Value Ref Range   TSH 0.565 0.350 - 4.500 uIU/mL    Comment: Performed by a 3rd Generation assay with a functional sensitivity of <=0.01 uIU/mL.    Blood Alcohol level:  No results found for: [facility]  Metabolic Disorder Labs: Lab Results  Component Value Date   HGBA1C 4.9 07/20/2016   MPG 94 07/20/2016   No results found for: PROLACTIN Lab Results  Component Value Date   CHOL 138 07/20/2016   TRIG 80 07/20/2016   HDL 40 (L) 07/20/2016   CHOLHDL 3.5 07/20/2016   VLDL 16 07/20/2016  LDLCALC 82 07/20/2016    Physical Findings: AIMS: Facial and Oral Movements Muscles of Facial Expression: None, normal Lips and Perioral Area: None, normal Jaw:  None, normal Tongue: None, normal,Extremity Movements Upper (arms, wrists, hands, fingers): None, normal Lower (legs, knees, ankles, toes): None, normal, Trunk Movements Neck, shoulders, hips: None, normal, Overall Severity Severity of abnormal movements (highest score from questions above): None, normal Incapacitation due to abnormal movements: None, normal Patient's awareness of abnormal movements (rate only patient's report): No Awareness, Dental Status Current problems with teeth and/or dentures?: No Does patient usually wear dentures?: No  CIWA:    COWS:  COWS Total Score: 7  Musculoskeletal: Strength & Muscle Tone: within normal limits Gait & Station: normal Patient leans: N/A  Psychiatric Specialty Exam: Physical Exam  Nursing note and vitals reviewed. Psychiatric: Her speech is normal. She is withdrawn and actively hallucinating. Cognition and memory are normal. She expresses impulsivity. She exhibits a depressed mood. She expresses suicidal ideation. She expresses suicidal plans.    Review of Systems  Psychiatric/Behavioral: Positive for depression, hallucinations, substance abuse and suicidal ideas.  All other systems reviewed and are negative.   Blood pressure 130/77, pulse 69, temperature 98.6 F (37 C), temperature source Oral, resp. rate 20, height 5\' 7"  (1.702 m), weight 88 kg (194 lb), last menstrual period 07/18/2016, SpO2 100 %.Body mass index is 30.38 kg/m.  General Appearance: Casual  Eye Contact:  Good  Speech:  Clear and Coherent  Volume:  Decreased  Mood:  Depressed and Hopeless  Affect:  Blunt  Thought Process:  Goal Directed and Descriptions of Associations: Intact  Orientation:  Full (Time, Place, and Person)  Thought Content:  Hallucinations: Auditory  Suicidal Thoughts:  Yes.  with intent/plan  Homicidal Thoughts:  No  Memory:  Immediate;   Fair Recent;   Fair Remote;   Fair  Judgement:  Poor  Insight:  Lacking  Psychomotor Activity:   Psychomotor Retardation  Concentration:  Concentration: Fair and Attention Span: Fair  Recall:  [employer] of Knowledge:  Fair  Language:  Fair  Akathisia:  No  Handed:  Right  AIMS (if indicated):     Assets:  Communication Skills Desire for Improvement Physical Health Resilience  ADL's:  Intact  Cognition:  WNL  Sleep:  Number of Hours: 7     Treatment Plan Summary: Daily contact with patient to assess and evaluate symptoms and progress in treatment and Medication management   Ms. Pedro is a 36 year old female with a history of depression and substance abuse admitted for auditory and visual hallucinations and suicidal ideation in the context of substance abuse and severe social stressors.  1. Suicidal ideation. The patient is able to contract for safety in the hospital.  2. Depression/Psychosis. She was started on low-dose Haldol in the emergency room. She is ready to try an antidepressant will give Prozac.  3. Heroine abuse. As last dose was yesterday at noon. She denies recent heavy use. Clonidine is available. She is not a drinker.  4. Substance abuse treatment. The patient desires residential treatment. She called [facility] already. Social worker will investigate.  5. Anxiety. Vistaril is available.  6. Insomnia. Trazodone is available.  7. Smoking. Nicotine patch is available.  8. UTI. We will give fosfomycin.  9. Metabolic syndrome monitoring. Lipid panel and TSH are normal. Hemoglobin A1c are pending.  10. EKG. Sinus rhythm. QTc 429.  11. Head CT scan performed in the emergency room was negative.  12. Disposition. To  be established.  Kristine Linea, MD 07/21/2016, 12:02 PM

## 2016-07-21 NOTE — Progress Notes (Signed)
Recreation Therapy Notes  Date: 03.01.18 Time: 9:30 am Location: Craft Room  Group Topic: Leisure Education  Goal Area(s) Addresses:  Patient will identify things they are grateful for. Patient will identify how being grateful can influence decision making.  Behavioral Response: Did not attend  Intervention: Grateful Wheel  Activity: Patients were given an I Am Grateful For worksheet and were instructed to write items they were grateful for under each category.  Education: LRT educated patients on leisure and why it is important.  Education Outcome: Patient did not attend group.  Clinical Observations/Feedback: Patient did not attend group.  Jacquelynn CreeGreene,Jamayah Myszka M, LRT/CTRS 07/21/2016 10:09 AM

## 2016-07-21 NOTE — [facility] Group Notes (Signed)
[facility] Group Notes:  (Nursing/MHT/Case Management/Adjunct)  Date:  07/21/2016  Time:  1:30 AM  Type of Therapy:  Psychoeducational Skills  Participation Level:  Active  Participation Quality:  Appropriate  Affect:  Appropriate  Cognitive:  Alert  Insight:  Good  Engagement in Group:  Supportive  Modes of Intervention:  Activity  Summary of Progress/Problems:  Emily NeerJackie L Elon Hutchinson 07/21/2016, 1:30 AM
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
## 2016-07-21 NOTE — Plan of Care (Signed)
Problem: Activity: Goal: Interest or engagement in activities will improve Outcome: Not Progressing Pt isolative to room most of the day. Noted to be sleeping in bed. Does not attend group.

## 2016-07-22 MED ORDER — HYDROXYZINE HCL 25 MG PO TABS: 25.0000 mg | ORAL_TABLET | Freq: Three times a day (TID) | ORAL | 1 refills | Status: DC | PRN

## 2016-07-22 MED ORDER — FLUOXETINE HCL 20 MG PO CAPS: 20.0000 mg | ORAL_CAPSULE | Freq: Every day | ORAL | 1 refills | Status: DC

## 2016-07-22 MED ORDER — TRAZODONE HCL 100 MG PO TABS: 100.0000 mg | ORAL_TABLET | Freq: Every day | ORAL | 1 refills | Status: DC

## 2016-07-22 MED ORDER — TRAZODONE HCL 100 MG PO TABS: 100.0000 mg | ORAL_TABLET | Freq: Every day | ORAL | Status: DC

## 2016-07-22 NOTE — Progress Notes (Signed)
Recreation Therapy Notes  INPATIENT RECREATION TR PLAN  Patient Details Name: Emily Hutchinson MRN: [id number] DOB: 08-Mar-1981 Today's Date: 07/22/2016  Rec Therapy Plan Is patient appropriate for Therapeutic Recreation?: Yes Treatment times per week: At least once a week TR Treatment/Interventions: 1:1 session, Group participation (Comment) (Appropriate participation in daily recreational therapy tx)  Discharge Criteria Pt will be discharged from therapy if:: Treatment goals are met, Discharged Treatment plan/goals/alternatives discussed and agreed upon by:: Patient/family  Discharge Summary Short term goals set: See Care Plan Short term goals met: Complete Progress toward goals comments: One-to-one attended Which groups?: Social skills One-to-one attended: Self-esteem, coping skills Reason goals not met: N/A Therapeutic equipment acquired: None Reason patient discharged from therapy: Discharge from hospital Pt/family agrees with progress & goals achieved: Yes Date patient discharged from therapy: 07/22/16   Leonette Monarch, LRT/CTRS 07/22/2016, 4:22 PM

## 2016-07-22 NOTE — [facility] Group Notes (Signed)
[facility] Group Notes:  (Nursing/MHT/Case Management/Adjunct)  Date:  07/22/2016  Time:  3:57 AM  Type of Therapy:  Psychoeducational Skills  Participation Level:  Active  Participation Quality:  Appropriate and Sharing  Affect:  Appropriate  Cognitive:  Appropriate  Insight:  Appropriate and Good  Engagement in Group:  Engaged  Modes of Intervention:  Discussion, Socialization and Support  Summary of Progress/Problems:  Emily Hutchinson 07/22/2016, 3:57 AM

## 2016-07-22 NOTE — Progress Notes (Signed)
  [facility] :  Will you be returning to the same living situation after discharge:  No.  At discharge, do you have transportation home?: Yes,  boyfriend Do you have the ability to pay for your medications: No. Referred to medication management.  Release of information consent forms completed and in the chart;  [name] needed at discharge.  Patient to Follow up at: Follow-up Information    [facility] Go on 07/26/2016.   Why:  [facility] 12[facility]30[facility] on Tuesday, 07/[facility]/16. Please bring photo ID and a copy of your hospital discharge paperwork. Contact information: [address] [place] [id number] [phone]           Next level of care provider has access to [facility] Link:no  Safety Planning and Suicide Prevention discussed: No. Attempts made.  Have you used any form of tobacco in the last 30 days? (Cigarettes, Smokeless Tobacco, Cigars, and/or Pipes): Yes  Has patient been referred to the Quitline?: Patient refused referral  Patient has been referred for addiction treatment: Yes  Lorri FrederickWierda, Nhung Danko Jon, LCSW 07/22/2016, 2:52 PM

## 2016-07-22 NOTE — [facility] Suicide Risk Assessment (Signed)
[facility]   Principal Problem: Amphetamine and psychostimulant-induced psychosis with hallucinations [facility](HCC) Discharge Diagnoses:  Patient Active Problem List   Diagnosis Date Noted  . 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 Severe recurrent major depression without psychotic features (HCC) [F33.2] 07/19/2016  . Opioid use disorder, moderate, dependence (HCC) [F11.20] 07/19/2016  . Cocaine use disorder, moderate, dependence (HCC) [F14.20] 07/19/2016  . 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 Tobacco use disorder [F17.200] 07/19/2016    Total Time spent with patient: 30 minutes  Musculoskeletal: Strength & Muscle Tone: within normal limits Gait & Station: normal Patient leans: N/A  Psychiatric Specialty Exam: Review of Systems  Psychiatric/Behavioral: Positive for substance abuse.  All other systems reviewed and are negative.   Blood pressure 124/77, pulse (!) 59, temperature 97.2 F (36.2 C), temperature source Oral, resp. rate 20, height 5\' 7"  (1.702 m), weight 88 kg (194 lb), last menstrual period 07/18/2016, SpO2 100 %.Body mass index is 30.38 kg/m.  General Appearance: Casual  Eye Contact::  Good  Speech:  Clear and Coherent409  Volume:  Normal  Mood:  Euthymic  Affect:  Appropriate and Depressed  Thought Process:  Goal Directed and Descriptions of Associations: Intact  Orientation:  Full (Time, Place, and Person)  Thought Content:  WDL  Suicidal Thoughts:  No  Homicidal Thoughts:  No  Memory:  Immediate;   Fair Recent;   Fair Remote;   Fair  Judgement:  Impaired  Insight:  Shallow  Psychomotor Activity:  Normal  Concentration:  Fair  Recall:  [employer]Fair  Fund of Knowledge:Fair  Language: Fair  Akathisia:  No  Handed:  Right  AIMS (if indicated):     Assets:  Communication Skills Desire for Improvement Physical Health Resilience Social Support  Sleep:  Number of Hours:  6.45  Cognition: WNL  ADL's:  Intact   Mental Status Per Nursing Assessment::   On Admission:  NA  Demographic Factors:  Divorced or widowed, Caucasian and Unemployed  Loss Factors: Financial problems/change in socioeconomic status  Historical Factors: Prior suicide attempts, Family history of mental illness or substance abuse and Impulsivity  Risk Reduction Factors:   Sense of responsibility to family and Positive social support  Continued Clinical Symptoms:  Depression:   Comorbid alcohol abuse/dependence Impulsivity Alcohol/Substance Abuse/Dependencies  Cognitive Features That Contribute To Risk:  None    Suicide Risk:  Minimal: No identifiable suicidal ideation.  Patients presenting with no risk factors but with morbid ruminations; may be classified as minimal risk based on the severity of the depressive symptoms    Plan Of Care/Follow-up recommendations:  Activity:  as tolerated. Diet:  Low sodium heart healthy. Other:  Keep follow-up appointments.  Kristine LineaJolanta Pucilowska, MD 07/22/2016, 9:26 AM

## 2016-07-22 NOTE — B[facility] Group Notes (Signed)
B[facility] LCSW Group Therapy  07/22/2016 3:50 PM  Type of Therapy:  Group Therapy  Participation Level:  Patient did not attend group. CSW invited patient to group.   Summary of Progress/Problems: Feelings around Relapse. Group members discussed the meaning of relapse and shared personal stories of relapse, how it affected them and others, and how they perceived themselves during this time. Group members were encouraged to identify triggers, warning signs and coping skills used when facing the possibility of relapse. Social supports were discussed and explored in detail. Patients also discussed facing disappointment and how that can trigger someone to relapse.  Eugene Zeiders G. Garnette CzechSampson MSW, LCSWA 07/22/2016, 3:51 PM

## 2016-07-22 NOTE — Plan of Care (Signed)
Problem: Education: Goal: Knowledge of disease or condition will improve Outcome: Not Met (add Reason) Calm and cooperative. Flat affect.  Attend group. Med compliant. Requesting medication for sleep. Clonidine 0.65m, Hydroxyzine 25 mg, Trazodone 100 given PRN at 2138. Support and encouragement provided. Medication adm w/education. Substance abuse maintained for safe and quiet environment during withdraw from drugs. q 15 min checks maintained.

## 2016-07-22 NOTE — Plan of Care (Signed)
Problem: [facility] Participation in Recreation Therapeutic Interventions Goal: STG-Patient will demonstrate improved self esteem by identif STG: Self-Esteem - Within 3 treatment sessions, patient will verbalize at least 5 positive affirmation statements in one treatment session to increase self-esteem.  Outcome: Completed/Met Date Met: 07/22/16 Treatment Session 1; Completed 1 out of 1: At approximately 11:40 am, LRT met with patient in craft room. Patient verbalized 5 positive affirmation statements. Patient reported it felt "silly". LRT encouraged patient to continue saying positive affirmation statements.   Leonette Monarch, LRT/CTRS 03.02.18 4:20 pm Goal: STG-Patient will identify at least five coping skills for ** STG: Coping Skills - Within 3 treatment sessions, patient will verbalize at least 5 coping skills for substance abuse in one treatment session to decrease substance abuse.  Outcome: Completed/Met Date Met: 07/22/16 Treatment Session 1; Completed 1 out of 1: At approximately 11:40 am, LRT met with patient in craft room. Patient verbalized 5 coping skills for substance abuse. LRT educated patient on leisure and why it is important to implement it into her schedule. LRT educated and provided patient with blank schedules to help her plan her day and try to avoid using substances. LRT educated patient on healthy support systems.  Leonette Monarch, LRT/CTRS 03.02.18 4:21 pm

## 2016-07-22 NOTE — Progress Notes (Signed)
Recreation Therapy Notes  Date: 03.02.18 Time: 1:00 pm Location: Craft Room  Group Topic: Communication, Problem Solving, Teamwork  Goal Area(s) Addresses:  Patient will effectively work with peer towards shared goal. Patient will identify skill used to make activity successful. Patient will identify how skills used during activity can be used to reach post d/c goals.  Behavioral Response: Attentive, Interactive  Intervention: Life Boat  Activity: Patients were given a scenario of going exploring and the boat getting a leak. Patients list of 16 people ([name], Scientist, [name], [name], broadcasting/film/videoteacher, etc.) and were instructed to put 8 people on a nicer, faster boat with the patients and 8 people on a raft.  Education: LRT educated patients on healthy support systems.  Education Outcome: Acknowledges education/In group clarification offered   Clinical Observations/Feedback: Patient arrived to group at approximately 1;25 pm. Patient contributed to group discussion by stating why communication, problem solving, and teamwork are important and what would change for her if she started using these skills effectively.  Jacquelynn CreeGreene,Tidus Upchurch M, LRT/CTRS 07/22/2016 1:49 PM

## 2016-07-22 NOTE — Progress Notes (Signed)
Denies SI/HI/AVH.  Affect sad.  Discharge instructions given, verbalized understanding.  Prescriptions and seven day supply given.  Personal belongings returned.  Escorted off unit by this Clinical research associatewriter to meet fiance to travel home.

## 2016-07-22 NOTE — Discharge Summary (Signed)
Physician Discharge Summary Note  Patient:  Emily Hutchinson is an 36 y.o., female MRN:  [id number] DOB:  [birth year] Patient phone:  [phone] (home)  Patient address:   [address]  [place] [id number],  Total Time spent with patient: 30 minutes  Date of Admission:  07/19/2016 Date of Discharge: 07/22/2016  Reason for Admission:  Suicidal ideation.  Identifying data. Emily Hutchinson is a 36 year old female with history of depression and substance abuse.  Chief complaint. "Drugs."  History of present illness. Information was obtained from the patient and the chart. The patient was brought to the emergency room by the police. Per her own report she was staying at the hotel with someone. She had a dose of heroine at noon. She was then walking [place] the [place] but was taken by police. Behavior. She was taken to the homeless shelter at the refused to take her in thinking that she was on drugs. The police took her to the emergency room. The patient was somewhat disorganized, hallucinating, and suicidal. She was admitted to the hospital for further treatment. She has a long history of substance abuse and some depression. She is ready to start residential substance abuse treatment program. [facility] in [place] already. It is unclear if patient can be accepted to this program as it is limited to the [facility]. The patient reports poor sleep, decreased appetite, anhedonia, feeling of guilt and hopelessness worthlessness, poor energy and concentration, social isolation, crying spells, frequent suicidal thoughts. For the past month she has been increasingly confused and hallucinating frequently. She believes that this is related to her being amphetamine binge a month ago. She believes that there was some permanent damage done to her brain and she has never been the same. She reports heightened anxiety especially when she cannot find any drugs. She denies  alcohol use. She has been using heroine, cocaine, methamphetamines, "you name it."  Past psychiatry history. She was hospitalized one time before after suicide attempt several years ago. She cut her wrist and took pills. She has been in detox several times. Couple years ago she went to a rehabilitation program in [place] where she stayed for 3 months. She was able to maintain sobriety for one month. A year or so ago she was still in methadone clinic.  Family psychiatric history. Multiple family members with depression and anxiety and substance abuse.  Social history. She is homeless and without resources. There is absolutely no support.  Principal Problem: Amphetamine and psychostimulant-induced psychosis with hallucinations [facility](HCC) Discharge Diagnoses: Patient Active Problem List   Diagnosis Date Noted  . 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 Severe recurrent major depression without psychotic features (HCC) [F33.2] 07/19/2016  . Opioid use disorder, moderate, dependence (HCC) [F11.20] 07/19/2016  . Cocaine use disorder, moderate, dependence (HCC) [F14.20] 07/19/2016  . 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 Tobacco use disorder [F17.200] 07/19/2016   Past Medical History:  Past Medical History:  Diagnosis Date  . IV drug user     Past Surgical History:  Procedure Laterality Date  . CESAREAN SECTION    . CHOLECYSTECTOMY     Family History: History reviewed. No pertinent family history.  Social History:  History  Alcohol Use No     History  Drug Use  . Types: Cocaine, IV, Methamphetamines    Comment: Heroin    Social History   Social History  . Marital status: Single  Spouse name: N/A  . Number of children: N/A  . Years of education: N/A   Social History Main Topics  . Smoking status: Current Every Day Smoker    Packs/day: 1.00    Types: Cigarettes  . Smokeless tobacco: Never Used  . Alcohol  use No  . Drug use: Yes    Types: Cocaine, IV, Methamphetamines     Comment: Heroin  . Sexual activity: Not Asked     Comment: methadone   Other Topics Concern  . None   Social History Narrative  . None    Hospital Course:    Emily Hutchinson is a 36 year old female with a history of depression and substance abuse admitted for auditory and visual hallucinations and suicidal ideation in the context of substance abuse and severe social stressors.  1. Suicidal ideation. Resolved. The patient is able to contract for safety. She is forward thinking and more optimistic about the future.  2. Depression/Psychosis. She was on low-dose Haldol until hallucinations resolved. We started Prozac for depression.  3. Heroine abuse. Clonidine, Zofran, Motrin were available.   4. Substance abuse treatment. The patient initially desired residential treatment but later changed her mind and chose [facility] program.   5. Anxiety. Vistaril is available.  6. Insomnia. Trazodone is available.  7. Smoking. Nicotine patch is available.  8. UTI. We gave fosfomycin.  9. Metabolic syndrome monitoring. Lipid panel, TSH and Hemoglobin A1c are normal.   10. EKG. Sinus rhythm. QTc 429.  11. Head CT scan performed in the emergency room was negative.  12. Disposition. She was discharged with her boyfriend. She will follow up with [facility].   Physical Findings: AIMS: Facial and Oral Movements Muscles of Facial Expression: None, normal Lips and Perioral Area: None, normal Jaw: None, normal Tongue: None, normal,Extremity Movements Upper (arms, wrists, hands, fingers): None, normal Lower (legs, knees, ankles, toes): None, normal, Trunk Movements Neck, shoulders, hips: None, normal, Overall Severity Severity of abnormal movements (highest score from questions above): None, normal Incapacitation due to abnormal movements: None, normal Patient's awareness of abnormal movements (rate only patient's report): No  Awareness, Dental Status Current problems with teeth and/or dentures?: No Does patient usually wear dentures?: No  CIWA:    COWS:  COWS Total Score: 7  Musculoskeletal: Strength & Muscle Tone: within normal limits Gait & Station: normal Patient leans: N/A  Psychiatric Specialty Exam: Physical Exam  Nursing note and vitals reviewed. Psychiatric: She has a normal mood and affect. Her speech is normal and behavior is normal. Thought content normal. Cognition and memory are normal. She expresses impulsivity.    Review of Systems  Psychiatric/Behavioral: Positive for substance abuse.  All other systems reviewed and are negative.   Blood pressure 124/77, pulse (!) 59, temperature 97.2 F (36.2 C), temperature source Oral, resp. rate 20, height 5\' 7"  (1.702 m), weight 88 kg (194 lb), last menstrual period 07/18/2016, SpO2 100 %.Body mass index is 30.38 kg/m.  General Appearance: Casual  Eye Contact:  Good  Speech:  Clear and Coherent  Volume:  Normal  Mood:  Euthymic  Affect:  Appropriate  Thought Process:  Goal Directed and Descriptions of Associations: Intact  Orientation:  Full (Time, Place, and Person)  Thought Content:  WDL  Suicidal Thoughts:  No  Homicidal Thoughts:  No  Memory:  Immediate;   Fair Recent;   Fair Remote;   Fair  Judgement:  Poor  Insight:  Shallow  Psychomotor Activity:  Normal  Concentration:  Concentration: Fair and  Attention Span: Fair  Recall:  [employer] of Knowledge:  Fair  Language:  Fair  Akathisia:  No  Handed:  Right  AIMS (if indicated):     Assets:  Communication Skills Desire for Improvement Physical Health Social Support  ADL's:  Intact  Cognition:  WNL  Sleep:  Number of Hours: 6.45     Have you used any form of tobacco in the last 30 days? (Cigarettes, Smokeless Tobacco, Cigars, and/or Pipes): Yes  Has this patient used any form of tobacco in the last 30 days? (Cigarettes, Smokeless Tobacco, Cigars, and/or Pipes) Yes, Yes, A  prescription for an FDA-approved tobacco cessation medication was offered at discharge and the patient refused  Blood Alcohol level:  No results found for: [facility]  Metabolic Disorder Labs:  Lab Results  Component Value Date   HGBA1C 4.9 07/20/2016   MPG 94 07/20/2016   No results found for: PROLACTIN Lab Results  Component Value Date   CHOL 138 07/20/2016   TRIG 80 07/20/2016   HDL 40 (L) 07/20/2016   CHOLHDL 3.5 07/20/2016   VLDL 16 07/20/2016   LDLCALC 82 07/20/2016    See Psychiatric Specialty Exam and Suicide Risk Assessment completed by Attending Physician prior to discharge.  Discharge destination:  Home  Is patient on multiple antipsychotic therapies at discharge:  No   Has Patient had three or more failed trials of antipsychotic monotherapy by history:  No  Recommended Plan for Multiple Antipsychotic Therapies: NA  Discharge Instructions    Diet - low sodium heart healthy    Complete by:  As directed    Increase activity slowly    Complete by:  As directed      Allergies as of 07/22/2016      Reactions   Adhesive [tape]       Medication List    STOP taking these medications   cephALEXin 500 MG capsule Commonly known as:  KEFLEX   cloNIDine 0.1 MG tablet Commonly known as:  CATAPRES   promethazine 12.5 MG tablet Commonly known as:  PHENERGAN     TAKE these medications     Indication  FLUoxetine 20 MG capsule Commonly known as:  PROZAC Take 1 capsule (20 mg total) by mouth at bedtime.  Indication:  Depression   hydrOXYzine 25 MG tablet Commonly known as:  ATARAX/VISTARIL Take 1 tablet (25 mg total) by mouth 3 (three) times daily as needed for anxiety.  Indication:  Anxiety Neurosis   traZODone 100 MG tablet Commonly known as:  DESYREL Take 1 tablet (100 mg total) by mouth at bedtime.  Indication:  Trouble Sleeping        Follow-up recommendations:  Activity:  As tolerated. Diet:  Low sodium heart healthy. Other:  Keep follow-up  appointments.  Comments:    Signed: Kristine Linea, MD 07/22/2016, 9:42 AM

## 2018-03-10 ENCOUNTER — Emergency Department (HOSPITAL_COMMUNITY): Payer: No Typology Code available for payment source

## 2018-03-10 ENCOUNTER — Inpatient Hospital Stay (HOSPITAL_COMMUNITY)
Admission: EM | Admit: 2018-03-10 | Discharge: 2018-03-12 | DRG: 552 | Disposition: A | Discharge status: Home / Self Care | Payer: No Typology Code available for payment source | Attending: General Surgery | Admitting: General Surgery

## 2018-03-10 ENCOUNTER — Encounter (HOSPITAL_COMMUNITY): Payer: Self-pay

## 2018-03-10 DIAGNOSIS — R402252 Coma scale, best verbal response, oriented, at arrival to emergency department: Secondary | ICD-10-CM | POA: Diagnosis present

## 2018-03-10 DIAGNOSIS — Z789 Other specified health status: Secondary | ICD-10-CM

## 2018-03-10 DIAGNOSIS — S32018A Other fracture of first lumbar vertebra, initial encounter for closed fracture: Secondary | ICD-10-CM | POA: Diagnosis present

## 2018-03-10 DIAGNOSIS — R402362 Coma scale, best motor response, obeys commands, at arrival to emergency department: Secondary | ICD-10-CM | POA: Diagnosis present

## 2018-03-10 DIAGNOSIS — S32038A Other fracture of third lumbar vertebra, initial encounter for closed fracture: Secondary | ICD-10-CM | POA: Diagnosis present

## 2018-03-10 DIAGNOSIS — N390 Urinary tract infection, site not specified: Secondary | ICD-10-CM | POA: Diagnosis present

## 2018-03-10 DIAGNOSIS — S22011A Stable burst fracture of first thoracic vertebra, initial encounter for closed fracture: Secondary | ICD-10-CM | POA: Diagnosis present

## 2018-03-10 DIAGNOSIS — M542 Cervicalgia: Secondary | ICD-10-CM | POA: Diagnosis not present

## 2018-03-10 DIAGNOSIS — F17210 Nicotine dependence, cigarettes, uncomplicated: Secondary | ICD-10-CM | POA: Diagnosis present

## 2018-03-10 DIAGNOSIS — F1510 Other stimulant abuse, uncomplicated: Secondary | ICD-10-CM | POA: Diagnosis present

## 2018-03-10 DIAGNOSIS — R402142 Coma scale, eyes open, spontaneous, at arrival to emergency department: Secondary | ICD-10-CM | POA: Diagnosis present

## 2018-03-10 DIAGNOSIS — T1490XA Injury, unspecified, initial encounter: Secondary | ICD-10-CM

## 2018-03-10 DIAGNOSIS — Y92410 Unspecified street and highway as the place of occurrence of the external cause: Secondary | ICD-10-CM | POA: Diagnosis not present

## 2018-03-10 DIAGNOSIS — S12600A Unspecified displaced fracture of seventh cervical vertebra, initial encounter for closed fracture: Principal | ICD-10-CM | POA: Diagnosis present

## 2018-03-10 DIAGNOSIS — S32009A Unspecified fracture of unspecified lumbar vertebra, initial encounter for closed fracture: Secondary | ICD-10-CM

## 2018-03-10 DIAGNOSIS — F1410 Cocaine abuse, uncomplicated: Secondary | ICD-10-CM | POA: Diagnosis present

## 2018-03-10 DIAGNOSIS — S129XXA Fracture of neck, unspecified, initial encounter: Secondary | ICD-10-CM

## 2018-03-10 LAB — CBC
HEMATOCRIT: 45.6 % (ref 36.0–46.0)
HEMOGLOBIN: 15 g/dL (ref 12.0–15.0)
MCH: 30.2 pg (ref 26.0–34.0)
MCHC: 32.9 g/dL (ref 30.0–36.0)
MCV: 91.8 fL (ref 80.0–100.0)
NRBC: 0 % (ref 0.0–0.2)
Platelets: 237 10*3/uL (ref 150–400)
RBC: 4.97 MIL/uL (ref 3.87–5.11)
RDW: 12.8 % (ref 11.5–15.5)
WBC: 9.3 10*3/uL (ref 4.0–10.5)

## 2018-03-10 LAB — COMPREHENSIVE METABOLIC PANEL
ALK PHOS: 77 U/L (ref 38–126)
ALT: 17 U/L (ref 0–44)
AST: 20 U/L (ref 15–41)
Albumin: 4.1 g/dL (ref 3.5–5.0)
Anion gap: 9 (ref 5–15)
BUN: 12 mg/dL (ref 6–20)
CALCIUM: 9.5 mg/dL (ref 8.9–10.3)
CO2: 22 mmol/L (ref 22–32)
CREATININE: 0.88 mg/dL (ref 0.44–1.00)
Chloride: 107 mmol/L (ref 98–111)
Glucose, Bld: 98 mg/dL (ref 70–99)
Potassium: 3.5 mmol/L (ref 3.5–5.1)
Sodium: 138 mmol/L (ref 135–145)
TOTAL PROTEIN: 7.4 g/dL (ref 6.5–8.1)
Total Bilirubin: 0.7 mg/dL (ref 0.3–1.2)

## 2018-03-10 LAB — I-STAT CHEM 8, ED
BUN: 14 mg/dL (ref 6–20)
CHLORIDE: 103 mmol/L (ref 98–111)
CREATININE: 0.9 mg/dL (ref 0.44–1.00)
Calcium, Ion: 1.2 mmol/L (ref 1.15–1.40)
GLUCOSE: 97 mg/dL (ref 70–99)
HCT: 45 % (ref 36.0–46.0)
Hemoglobin: 15.3 g/dL — ABNORMAL HIGH (ref 12.0–15.0)
Potassium: 3.5 mmol/L (ref 3.5–5.1)
Sodium: 140 mmol/L (ref 135–145)
TCO2: 27 mmol/L (ref 22–32)

## 2018-03-10 LAB — PROTIME-INR
INR: 0.95
PROTHROMBIN TIME: 12.6 s (ref 11.4–15.2)

## 2018-03-10 LAB — I-STAT BETA HCG BLOOD, ED (MC, WL, AP ONLY)

## 2018-03-10 LAB — I-STAT CG4 LACTIC ACID, ED: LACTIC ACID, VENOUS: 0.71 mmol/L (ref 0.5–1.9)

## 2018-03-10 LAB — CDS SEROLOGY

## 2018-03-10 LAB — ETHANOL: Alcohol, Ethyl (B): <10 mg/dL (ref <10)

## 2018-03-10 MED ORDER — HYDROMORPHONE HCL 1 MG/ML IJ SOLN: 1.0000 mg | Freq: Once | INTRAMUSCULAR | Status: DC

## 2018-03-10 MED ORDER — FENTANYL CITRATE (PF) 100 MCG/2ML IJ SOLN
50.0000 ug | Freq: Once | INTRAMUSCULAR | Status: AC
  Administered 2018-03-10: 50 ug via INTRAVENOUS
  Filled 2018-03-10: qty 2

## 2018-03-10 MED ORDER — IOHEXOL 300 MG/ML  SOLN
100.0000 mL | Freq: Once | INTRAMUSCULAR | Status: AC | PRN
  Administered 2018-03-10: 100 mL via INTRAVENOUS

## 2018-03-10 NOTE — Progress Notes (Signed)
Neurosurgery note  CT reviewed, shows C7, T1, L1, L3 transverse process fractures, stable fracture pattern, no urgent neurosurgical intervention indicated. I am in the OR for an emergent surgical case and can evaluate the patient afterwards.
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
## 2018-03-10 NOTE — ED Notes (Signed)
Patient transported to CT 

## 2018-03-11 ENCOUNTER — Other Ambulatory Visit: Payer: Self-pay

## 2018-03-11 LAB — URINALYSIS, ROUTINE W REFLEX MICROSCOPIC
Bacteria, UA: NONE SEEN
Bilirubin Urine: NEGATIVE
Glucose, UA: NEGATIVE mg/dL
Ketones, ur: NEGATIVE mg/dL
Nitrite: POSITIVE — AB
PH: 5 (ref 5.0–8.0)
Protein, ur: NEGATIVE mg/dL
Specific Gravity, Urine: >1.046 — ABNORMAL HIGH (ref 1.005–1.030)

## 2018-03-11 LAB — RAPID URINE DRUG SCREEN, HOSP PERFORMED
Amphetamines: POSITIVE — AB
Barbiturates: NOT DETECTED
Benzodiazepines: NOT DETECTED
Cocaine: NOT DETECTED
Opiates: NOT DETECTED
TETRAHYDROCANNABINOL: NOT DETECTED

## 2018-03-11 MED ORDER — FLUOXETINE HCL 20 MG PO CAPS
20.0000 mg | ORAL_CAPSULE | Freq: Every day | ORAL | Status: DC
  Administered 2018-03-11: 20 mg via ORAL
  Filled 2018-03-11: qty 1

## 2018-03-11 MED ORDER — ONDANSETRON HCL 4 MG/2ML IJ SOLN: 4.0000 mg | Freq: Four times a day (QID) | INTRAMUSCULAR | Status: DC | PRN

## 2018-03-11 MED ORDER — ACETAMINOPHEN 325 MG PO TABS
650.0000 mg | ORAL_TABLET | ORAL | Status: DC | PRN
  Administered 2018-03-11: 650 mg via ORAL
  Filled 2018-03-11: qty 2

## 2018-03-11 MED ORDER — POTASSIUM CHLORIDE 2 MEQ/ML IV SOLN
INTRAVENOUS | Status: DC
  Administered 2018-03-11: 01:00:00 via INTRAVENOUS
  Filled 2018-03-11 (×4): qty 1000

## 2018-03-11 MED ORDER — DOCUSATE SODIUM 100 MG PO CAPS
100.0000 mg | ORAL_CAPSULE | Freq: Two times a day (BID) | ORAL | Status: DC
  Administered 2018-03-11 – 2018-03-12 (×3): 100 mg via ORAL
  Filled 2018-03-11 (×3): qty 1

## 2018-03-11 MED ORDER — POTASSIUM CHLORIDE 2 MEQ/ML IV SOLN: INTRAVENOUS | Status: DC

## 2018-03-11 MED ORDER — ONDANSETRON 4 MG PO TBDP: 4.0000 mg | ORAL_TABLET | Freq: Four times a day (QID) | ORAL | Status: DC | PRN

## 2018-03-11 MED ORDER — OXYCODONE HCL 5 MG PO TABS
5.0000 mg | ORAL_TABLET | ORAL | Status: DC | PRN
  Administered 2018-03-11 – 2018-03-12 (×4): 5 mg via ORAL
  Filled 2018-03-11 (×4): qty 1

## 2018-03-11 MED ORDER — TRAZODONE HCL 100 MG PO TABS
100.0000 mg | ORAL_TABLET | Freq: Every day | ORAL | Status: DC
  Administered 2018-03-11 (×2): 100 mg via ORAL
  Filled 2018-03-11 (×2): qty 1

## 2018-03-11 MED ORDER — HYDROXYZINE HCL 25 MG PO TABS
25.0000 mg | ORAL_TABLET | Freq: Three times a day (TID) | ORAL | Status: DC | PRN
  Administered 2018-03-11 (×2): 25 mg via ORAL
  Filled 2018-03-11 (×2): qty 1

## 2018-03-11 MED ORDER — MORPHINE SULFATE (PF) 2 MG/ML IV SOLN
1.0000 mg | INTRAVENOUS | Status: DC | PRN
  Administered 2018-03-11: 1 mg via INTRAVENOUS
  Administered 2018-03-11 (×2): 2 mg via INTRAVENOUS
  Filled 2018-03-11 (×3): qty 1

## 2018-03-11 NOTE — Progress Notes (Signed)
Pt refused her blood work twice. Per pt "it's too painful. I can't stand it." Pt is educated with no success. Will continue to monitor.
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
## 2018-03-11 NOTE — ED Notes (Signed)
Placed in Aspen collar while maintaining c-spine

## 2018-03-11 NOTE — Plan of Care (Signed)
  Problem: Education: Goal: Knowledge of General Education information will improve Description: Including pain rating scale, medication(s)/side effects and non-pharmacologic comfort measures Outcome: Progressing   Problem: Pain Managment: Goal: General experience of comfort will improve Outcome: Progressing   Problem: Safety: Goal: Ability to remain free from injury will improve Outcome: Progressing   
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
## 2018-03-12 MED ORDER — ACETAMINOPHEN 325 MG PO TABS: 650.0000 mg | ORAL_TABLET | Freq: Four times a day (QID) | ORAL | Status: DC | PRN

## 2018-03-12 MED ORDER — CIPROFLOXACIN HCL 500 MG PO TABS: 500.0000 mg | ORAL_TABLET | Freq: Two times a day (BID) | ORAL | 0 refills | Status: AC

## 2018-03-12 MED ORDER — MELOXICAM 15 MG PO TABS: 15.0000 mg | ORAL_TABLET | Freq: Every day | ORAL | 0 refills | Status: DC

## 2018-03-12 MED ORDER — METHOCARBAMOL 500 MG PO TABS
500.0000 mg | ORAL_TABLET | Freq: Three times a day (TID) | ORAL | Status: DC
  Administered 2018-03-12: 500 mg via ORAL
  Filled 2018-03-12: qty 1

## 2018-03-12 MED ORDER — OXYCODONE HCL 5 MG PO TABS: 5.0000 mg | ORAL_TABLET | ORAL | 0 refills | Status: DC | PRN

## 2018-03-12 MED ORDER — METHOCARBAMOL 500 MG PO TABS: 500.0000 mg | ORAL_TABLET | Freq: Three times a day (TID) | ORAL | 0 refills | Status: DC | PRN

## 2018-03-12 MED ORDER — ACETAMINOPHEN 325 MG PO TABS
650.0000 mg | ORAL_TABLET | Freq: Four times a day (QID) | ORAL | Status: DC
  Administered 2018-03-12: 650 mg via ORAL
  Filled 2018-03-12: qty 2

## 2018-03-12 MED ORDER — MELOXICAM 7.5 MG PO TABS
15.0000 mg | ORAL_TABLET | Freq: Every day | ORAL | Status: DC
  Administered 2018-03-12: 15 mg via ORAL
  Filled 2018-03-12: qty 2

## 2018-03-12 MED ORDER — CIPROFLOXACIN HCL 500 MG PO TABS
500.0000 mg | ORAL_TABLET | Freq: Two times a day (BID) | ORAL | Status: DC
  Administered 2018-03-12: 500 mg via ORAL
  Filled 2018-03-12: qty 1

## 2018-03-12 NOTE — Progress Notes (Signed)
Patient discharging home today. Discharge instructions explained to patient and she verbalized understanding. Took all personal belongings. No further questions or concerns voiced.  
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
## 2018-03-12 NOTE — Plan of Care (Signed)
  Problem: Education: Goal: Knowledge of General Education information will improve Description Including pain rating scale, medication(s)/side effects and non-pharmacologic comfort measures Outcome: Progressing   Problem: Activity: Goal: Risk for activity intolerance will decrease Outcome: Progressing   Problem: Safety: Goal: Ability to remain free from injury will improve Outcome: Progressing   

## 2018-03-12 NOTE — Discharge Instructions (Signed)
Cervical collar to be worn as needed for comfort/support. Brace may be removed to shower. Follow up with a primary care provider regarding further pain control as needed. Call Neurosurgeon (Dr. Maurice Small) for acutely worsened pain or new neurologic symptoms, such as numbness/tingling/paralysis.   Transverse Process Fracture Each bone of the spine (vertebra) has portions of bone that extend off to either side of the spine. These portions of bone are called transverse processes. A transverse process fracture, which is also called a rotation spine fracture, is a break in a transverse process. What are the causes? This condition may be caused by:  A fall from a height.  A car accident.  A sports injury.  A gunshot wound.  A hard, direct hit to the back.  This kind of fracture often results from a sudden and severe bending of the spine to one side. What increases the risk? This condition is more likely to develop in:  People who have thinning and loss of density in the bones (osteoporosis).  People who play a contact sport.  What are the signs or symptoms? The main symptom of this condition is back pain. The pain may be felt on the side of the spine (flank) where the fracture is. It may get worse when you move or take deep a deep breath. How is this diagnosed? This condition may be diagnosed based on symptoms, a medical history, and a physical exam. During the physical exam, your health care provider may tap along the length of your spine to see where you feel pain. Imaging tests may be done to confirm the diagnosis. They may include:  X-rays.  A CT scan.  MRI.  How is this treated? Most transverse process fractures heal on their own with time and with rest. Treatment may involve supportive care, such as:  A back brace.  Activity limits.  Pain medicine.  Muscle-relaxing medicine.  Physical therapy.  Follow these instructions at home: General instructions  Take medicines  only as directed by your health care provider.  Do not drive or operate heavy machinery while taking pain medicine.  Wear your neck or back brace as directed by your health care provider.  Keep all follow-up visits as directed by your health care provider. This is important. It can help to prevent permanent injury, disability, and long-lasting (chronic) pain. Activity  Stay in bed (on bed rest) only as directed by your health care provider. Being on bed rest for too long can make your condition worse.  Return to your normal activities when your health care provider says it is okay. Ask if there are any activities that you should not do.  Do your physical therapy as recommended by your health care provider. Contact a health care provider if:  You have a fever.  You develop a cough that makes your pain worse.  Your pain medicine is not helping.  Your pain does not get better over time.  You cannot return to your normal activities as planned or expected. Get help right away if:  Your pain is very bad and it suddenly gets worse.  You are unable to move any body part (paralysis) that is below the level of your injury.  You have numbness, tingling, or weakness in any body part that is below the level of your injury.  You cannot control your bladder or bowels. This information is not intended to replace advice given to you by your health care provider. Make sure you discuss any questions you  have with your health care provider. Document Released: 08/24/2006 Document Revised: 01/10/2016 Document Reviewed: 05/13/2014 Elsevier Interactive Patient Education  [employer].
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
## 2018-10-05 ENCOUNTER — Encounter: Payer: Self-pay | Admitting: Emergency Medicine

## 2018-10-05 ENCOUNTER — Other Ambulatory Visit: Payer: Self-pay

## 2018-10-05 ENCOUNTER — Emergency Department
Admission: EM | Admit: 2018-10-05 | Discharge: 2018-10-06 | Disposition: A | Discharge status: Home / Self Care | Payer: Self-pay | Attending: Emergency Medicine | Admitting: Emergency Medicine

## 2018-10-05 DIAGNOSIS — F17210 Nicotine dependence, cigarettes, uncomplicated: Secondary | ICD-10-CM | POA: Insufficient documentation

## 2018-10-05 DIAGNOSIS — T401X1A Poisoning by heroin, accidental (unintentional), initial encounter: Secondary | ICD-10-CM | POA: Insufficient documentation

## 2018-10-05 MED ORDER — SODIUM CHLORIDE 0.9 % IV BOLUS
1000.0000 mL | Freq: Once | INTRAVENOUS | Status: AC
  Administered 2018-10-05: 1000 mL via INTRAVENOUS

## 2018-10-05 NOTE — ED Triage Notes (Signed)
Patient brought in by ems. Patient admits to using heroin tonight. Patient's friends found her with decrease respirations and unresponsive. Patient was given 4 mg intranasal narcan. Patient now alert and oriented.
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
## 2018-10-06 NOTE — ED Notes (Signed)
Pt given water to drink. 

## 2018-10-06 NOTE — ED Notes (Signed)
Pt given phone to call for a ride.  

## 2018-10-06 NOTE — ED Notes (Signed)
Pt wakes to voice, waiting on ride. NAD

## 2021-09-13 ENCOUNTER — Encounter (HOSPITAL_BASED_OUTPATIENT_CLINIC_OR_DEPARTMENT_OTHER): Payer: Self-pay

## 2021-09-13 ENCOUNTER — Emergency Department
Admission: EM | Admit: 2021-09-13 | Discharge: 2021-09-13 | Disposition: A | Discharge status: Home / Self Care | Payer: Self-pay | Attending: Emergency Medicine | Admitting: Emergency Medicine

## 2021-09-13 ENCOUNTER — Other Ambulatory Visit: Payer: Self-pay

## 2021-09-13 DIAGNOSIS — T402X1A Poisoning by other opioids, accidental (unintentional), initial encounter: Secondary | ICD-10-CM | POA: Insufficient documentation

## 2021-09-13 DIAGNOSIS — F1910 Other psychoactive substance abuse, uncomplicated: Secondary | ICD-10-CM | POA: Insufficient documentation

## 2021-09-13 DIAGNOSIS — R410 Disorientation, unspecified: Secondary | ICD-10-CM | POA: Insufficient documentation

## 2021-09-13 DIAGNOSIS — Z87898 Personal history of other specified conditions: Secondary | ICD-10-CM | POA: Insufficient documentation

## 2021-09-13 HISTORY — DX: Poisoning by unspecified drugs, medicaments and biological substances, accidental (unintentional), initial encounter: T50.901A

## 2021-09-13 LAB — HCG, URINE QUALITATIVE, PREGNANCY: HCG URINE QUALITATIVE: NEGATIVE

## 2021-09-13 NOTE — ED Nurses Note (Signed)
Patient given written and verbal d/c instructions. All questions/concerns addressed. Informed to return with any concerns. Crisis phone numbers given to patient for drug rehabilitation. Patient given moral support at this time, but continues to decline rehab at this time, provider in room. Patient leaving ambulatory at this time with clothes and belongings.

## 2021-09-13 NOTE — ED Nurses Note (Addendum)
When Pt arrived to the ED pt was shaking and cold. PT skin was cold tot he touch and pt was wet. With the help of a female nurse Felipa Evener) assisted pt in removing her wet clothes and changing into a gown. Pt was then provided with warm blankets to help improve temperature.

## 2021-09-13 NOTE — ED Triage Notes (Signed)
Per EMS-called out for pt overdose around 445am. Friends gave narcan then called for reported fentanyl overdose-unintentional, with hx of the same. EMS reports pt 4-4-6 GCS (14). Pt denies any other medical hx other than past overdoses. PT states nauseated and tearful upon arrival. Given Zofran ODT prior to arrival.

## 2021-09-13 NOTE — ED Nurses Note (Signed)
When collecting urine sample from pt a small bag of crystalin powder fell out of the pt's vagina and into the bedside commode. MD and Charge nurse notified. Baggie collected and given to charge nurse. Charge nurse Vangie Bicker then contacted local PD.

## 2021-09-13 NOTE — ED Nurses Note (Signed)
Officer Dahlia Client from [place] [employer] was called and arrived to collect a small bag of drugs that was found on a pt when trying to give urine sample.

## 2021-09-13 NOTE — ED Nurses Note (Signed)
Patient lying in bed at this time, respirations are even and unlabored. Patient awakens to verbal stimuli. Patient offered breakfast and declines, stating that she would like to just sleep. Denies any further needs at this time, will continue to monitor.

## 2021-09-13 NOTE — ED Nurses Note (Signed)
Patient awakens at this time, alert and oriented x4. Patient reports being in the emergency room but does not remember the events leading up to arrival. Patient denies any SI/HI at this time. Denies any needs for drug rehab. Patient provided breakfast and PO fluids at this time. Will continue to monitor. Provider updated of status.
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
## 2021-09-13 NOTE — ED Nurses Note (Signed)
Patient sitting up at bedside, continues to deny any shortness of breath, cough, or nausea. Again provided with sandwich tray upon request. Patient denies any chest pain.

## 2021-09-13 NOTE — Discharge Instructions (Signed)
You had a self admitted accidental fentanyl overdose last night, highly recommend that you get Narcan, and also recommend that you see inpatient or outpatient rehabilitation.  Continue any prescribed medications that she may have, call follow-up with a primary care provider.

## 2021-10-26 ENCOUNTER — Emergency Department: Payer: Self-pay

## 2021-10-26 ENCOUNTER — Other Ambulatory Visit: Payer: Self-pay

## 2021-10-26 ENCOUNTER — Encounter: Payer: Self-pay | Admitting: Emergency Medicine

## 2021-10-26 ENCOUNTER — Emergency Department
Admission: EM | Admit: 2021-10-26 | Discharge: 2021-10-26 | Disposition: A | Discharge status: Home / Self Care | Payer: Self-pay | Attending: Emergency Medicine | Admitting: Emergency Medicine

## 2021-10-26 DIAGNOSIS — T40411A Poisoning by fentanyl or fentanyl analogs, accidental (unintentional), initial encounter: Secondary | ICD-10-CM | POA: Insufficient documentation

## 2021-10-26 DIAGNOSIS — T40601A Poisoning by unspecified narcotics, accidental (unintentional), initial encounter: Secondary | ICD-10-CM

## 2021-10-26 DIAGNOSIS — Z720 Tobacco use: Secondary | ICD-10-CM | POA: Insufficient documentation

## 2021-10-26 DIAGNOSIS — R4182 Altered mental status, unspecified: Secondary | ICD-10-CM | POA: Insufficient documentation

## 2021-10-26 MED ORDER — METOCLOPRAMIDE HCL 5 MG/ML IJ SOLN
10.0000 mg | Freq: Once | INTRAMUSCULAR | Status: AC
  Administered 2021-10-26: 10 mg via INTRAVENOUS
  Filled 2021-10-26: qty 2

## 2021-10-26 MED ORDER — NALOXONE HCL 4 MG/0.1ML NA LIQD: NASAL | 1 refills | Status: DC

## 2021-10-26 MED ORDER — KETOROLAC TROMETHAMINE 15 MG/ML IJ SOLN
15.0000 mg | Freq: Once | INTRAMUSCULAR | Status: AC
  Administered 2021-10-26: 15 mg via INTRAVENOUS
  Filled 2021-10-26: qty 1

## 2021-10-26 MED ORDER — SODIUM CHLORIDE 0.9 % IV BOLUS
1000.0000 mL | Freq: Once | INTRAVENOUS | Status: AC
  Administered 2021-10-26: 1000 mL via INTRAVENOUS
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
## 2021-10-26 NOTE — ED Triage Notes (Signed)
Pt to ED via [facility] from [employer] [facility] lot for unintentional drug overdose that occurred about an hour PTA. Per EMS Pt was found unresponsive and had agonal respirations. Pt was given 2 mg of narcan intranasal. Pt states she took fentanyl this afternoon. Pt states she also uses meth daily, denies taking meth and fentanyl at same time. Pt denies using fentanyl every day.   Pt c/o severe frontal headache and nausea that started when EMS arrived. Pt has SOB and occasional paraesthesia in hands.   Pt tearful and anxious upon assessment but cooperative Pt is A&Ox4.

## 2023-01-24 ENCOUNTER — Emergency Department
Admission: EM | Admit: 2023-01-24 | Discharge: 2023-01-24 | Disposition: A | Discharge status: Home / Self Care | Payer: Self-pay | Attending: Emergency Medicine | Admitting: Emergency Medicine

## 2023-01-24 ENCOUNTER — Encounter: Payer: Self-pay | Admitting: Emergency Medicine

## 2023-01-24 DIAGNOSIS — X58XXXA Exposure to other specified factors, initial encounter: Secondary | ICD-10-CM | POA: Diagnosis not present

## 2023-01-24 DIAGNOSIS — T402X1A Poisoning by other opioids, accidental (unintentional), initial encounter: Secondary | ICD-10-CM | POA: Insufficient documentation

## 2023-01-24 DIAGNOSIS — T50901A Poisoning by unspecified drugs, medicaments and biological substances, accidental (unintentional), initial encounter: Secondary | ICD-10-CM | POA: Diagnosis present

## 2023-01-24 DIAGNOSIS — T40601A Poisoning by unspecified narcotics, accidental (unintentional), initial encounter: Secondary | ICD-10-CM

## 2023-01-24 HISTORY — DX: Anxiety disorder, unspecified: F41.9

## 2023-01-24 HISTORY — DX: Depression, unspecified: F32.A

## 2023-01-24 LAB — SALICYLATE LEVEL: Salicylate Lvl: <7 mg/dL — ABNORMAL LOW (ref 7.0–30.0)

## 2023-01-24 LAB — CBC
HCT: 42.6 % (ref 36.0–46.0)
Hemoglobin: 13.7 g/dL (ref 12.0–15.0)
MCH: 28.2 pg (ref 26.0–34.0)
MCHC: 32.2 g/dL (ref 30.0–36.0)
MCV: 87.7 fL (ref 80.0–100.0)
Platelets: 192 10*3/uL (ref 150–400)
RBC: 4.86 MIL/uL (ref 3.87–5.11)
RDW: 13.7 % (ref 11.5–15.5)
WBC: 6.5 10*3/uL (ref 4.0–10.5)
nRBC: 0 % (ref 0.0–0.2)

## 2023-01-24 LAB — COMPREHENSIVE METABOLIC PANEL
ALT: 37 U/L (ref 0–44)
AST: 34 U/L (ref 15–41)
Albumin: 4.1 g/dL (ref 3.5–5.0)
Alkaline Phosphatase: 87 U/L (ref 38–126)
Anion gap: 8 (ref 5–15)
BUN: 12 mg/dL (ref 6–20)
CO2: 26 mmol/L (ref 22–32)
Calcium: 9.1 mg/dL (ref 8.9–10.3)
Chloride: 104 mmol/L (ref 98–111)
Creatinine, Ser: 0.84 mg/dL (ref 0.44–1.00)
GFR, Estimated: >60 mL/min (ref >60)
Glucose, Bld: 160 mg/dL — ABNORMAL HIGH (ref 70–99)
Potassium: 3.3 mmol/L — ABNORMAL LOW (ref 3.5–5.1)
Sodium: 138 mmol/L (ref 135–145)
Total Bilirubin: 0.8 mg/dL (ref 0.3–1.2)
Total Protein: 8.3 g/dL — ABNORMAL HIGH (ref 6.5–8.1)

## 2023-01-24 LAB — ETHANOL: Alcohol, Ethyl (B): <10 mg/dL (ref <10)

## 2023-01-24 LAB — ACETAMINOPHEN LEVEL: Acetaminophen (Tylenol), Serum: <10 ug/mL — ABNORMAL LOW (ref 10–30)

## 2023-01-24 MED ORDER — NALOXONE HCL 4 MG/0.1ML NA LIQD: 1.0000 | Freq: Once | NASAL | 1 refills | Status: AC

## 2023-01-24 MED ORDER — ONDANSETRON HCL 4 MG/2ML IJ SOLN
4.0000 mg | Freq: Once | INTRAMUSCULAR | Status: AC
  Administered 2023-01-24: 4 mg via INTRAVENOUS
  Filled 2023-01-24: qty 2

## 2023-01-24 MED ORDER — LACTATED RINGERS IV BOLUS
1000.0000 mL | Freq: Once | INTRAVENOUS | Status: AC
  Administered 2023-01-24: 1000 mL via INTRAVENOUS

## 2023-01-24 MED ORDER — ACETAMINOPHEN 500 MG PO TABS
1000.0000 mg | ORAL_TABLET | Freq: Once | ORAL | Status: AC
  Administered 2023-01-24: 1000 mg via ORAL
  Filled 2023-01-24: qty 2

## 2023-01-24 NOTE — ED Notes (Signed)
Pt rounding complete at this time.  Pt awakens easily to voice, but still appears groggy.  Pt given some water per request.  Pt denies any other needs at this time.  Call light within reach.  Stretcher low and locked at this time.

## 2023-01-24 NOTE — ED Triage Notes (Signed)
Pt arrived via [facility] from "friends" house who called after pt found unresponsive, post IV Fentanyl use. First responders, [employer], administered 2 intranasal sprays of Narcan with immediate arousal of pt. Pt arrived alert and oriented x4. Pt admitted to using IV Fentanyl and stated she has been using regularly since release from prison in April 2023. Pt expresses hopelessness and desire to get clean from her current illegal substance abuse.    **Pt asked if any illegal substances or needles on her that could harm staff and pt sts, "No, if I did it would be in my bra but I don't have any." During change out of personal belongings and into hospital gown, unknown substance in bag found with report from pt that it is Fentanyl. Pt sts, "I'm sorry I forgot that was in there. I would have surrendered it if I would have known."
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
## 2023-01-24 NOTE — ED Provider Notes (Addendum)
[facility] Provider Note    Event Date/Time   First MD Initiated Contact with Patient 01/24/23 0222     (approximate)   History   Drug Overdose   HPI  Emily Hutchinson is a 42 y.o. female who presents to the ED for evaluation of Drug Overdose   Patient presents to the ED from the field via EMS for evaluation of accidental opiate overdose.  She was reportedly at a friend's house, using intravenous opiates, and accidentally overdosed.  Denies any suicidal intent or intent at self-harm.  Received intranasal Narcan with first responders.   Feeling mildly nauseous on arrival to the ED.  No pain.  No preceding illnesses   Physical Exam   Triage Vital Signs: ED Triage Vitals  Encounter Vitals Group     BP 01/24/23 0328 112/81     Systolic BP Percentile --      Diastolic BP Percentile --      Pulse Rate 01/24/23 0225 92     Resp 01/24/23 0225 16     Temp 01/24/23 0225 98.4 F (36.9 C)     Temp Source 01/24/23 0225 Oral     SpO2 01/24/23 0225 100 %     Weight --      Height --      Head Circumference --      Peak Flow --      Pain Score --      Pain Loc --      Pain Education --      Exclude from Growth Chart --     Most recent vital signs: Vitals:   01/24/23 0512 01/24/23 0600  BP: 100/61 (!) 93/53  Pulse: 64 62  Resp: 17 14  Temp:    SpO2: 100% 97%    General: Awake, no distress.  Tearful, linear thought processes CV:  Good peripheral perfusion.  Resp:  Normal effort.  Abd:  No distention.  MSK:  No deformity noted.  Neuro:  No focal deficits appreciated. Other:     ED Results / Procedures / Treatments   Labs (all labs ordered are listed, but only abnormal results are displayed) Labs Reviewed  COMPREHENSIVE METABOLIC PANEL - Abnormal; Notable for the following components:      Result Value   Potassium 3.3 (*)    Glucose, Bld 160 (*)    Total Protein 8.3 (*)    All other components within normal limits  SALICYLATE LEVEL -  Abnormal; Notable for the following components:   Salicylate Lvl <7.0 (*)    All other components within normal limits  ACETAMINOPHEN LEVEL - Abnormal; Notable for the following components:   Acetaminophen (Tylenol), Serum <10 (*)    All other components within normal limits  ETHANOL  CBC  URINE DRUG SCREEN, QUALITATIVE ([facility] ONLY)  POC URINE PREG, ED    EKG Sinus rhythm with a rate of 91 bpm.  Normal axis and intervals.  No clear signs of acute ischemia.  Wandering baseline clouds fine detail  RADIOLOGY   Official radiology report(s): No results found.  PROCEDURES and INTERVENTIONS:  .1-3 Lead EKG Interpretation  Performed by: Delton Prairie, MD Authorized by: Delton Prairie, MD     Interpretation: normal     ECG rate:  70   ECG rate assessment: normal     Rhythm: sinus rhythm     Ectopy: none     Conduction: normal     Medications  lactated ringers bolus 1,000 mL (0 mLs  Intravenous Stopped 01/24/23 0512)  ondansetron (ZOFRAN) injection 4 mg (4 mg Intravenous Given 01/24/23 0336)  acetaminophen (TYLENOL) tablet 1,000 mg (1,000 mg Oral Given 01/24/23 0336)     IMPRESSION / MDM / ASSESSMENT AND PLAN / ED COURSE  I reviewed the triage vital signs and the nursing notes.  Differential diagnosis includes, but is not limited to, polysubstance abuse, overdose intentional or accidental, poisoning, metabolic encephalopathy  {Patient presents with symptoms of an acute illness or injury that is potentially life-threatening.  Patient presents after accidental opiate overdose requiring Narcan in the field.  Reassuring clinical picture here without signs of neurologic deficits, trauma or distress.  Remained stable without indications for repeat dosing of Narcan.  Screening serum workup is reassuring with normal CBC and reassuring metabolic panel.  She is maintained on the monitor without dysrhythmias or need for more Narcan.  Denies any suicidal intent but is requesting resources and we do  have our TTS team see her and provide local resources.  Suspect she will be suitable for outpatient management  Clinical Course as of 01/24/23 0632  Tue Jan 24, 2023  5784 reassessed [DS]  0631 Reassessed, patient awake, sitting upright and looking improved.  She reports feeling better and is asking for some food, which we will provide.  Again assessed suicidality and she declines any of these thoughts and is comfortable going home.  We again discussed the resources that she received from TTS.  We discussed Narcan prescription [DS]    Clinical Course User Index [DS] Delton Prairie, MD     FINAL CLINICAL IMPRESSION(S) / ED DIAGNOSES   Final diagnoses:  Opiate overdose, accidental or unintentional, initial encounter [facility])     Rx / DC Orders   ED Discharge Orders          Ordered    naloxone (NARCAN) nasal spray 4 mg/0.1 mL   Once        01/24/23 0631             Note:  This document was prepared using Dragon voice recognition software and may include unintentional dictation errors.   Delton Prairie, MD 01/24/23 0600    Delton Prairie, MD 01/24/23 [phone]

## 2023-01-24 NOTE — ED Notes (Signed)
This writer attempted to bring in DC papers for pt.  Pt seen sitting in stretcher, holding sandwich, but asleep.  Pt awakens to voice, but falls back asleep quickly without interaction.  MD Katrinka Blazing aware of findings.

## 2023-01-24 NOTE — ED Notes (Addendum)
[employer] called to properly dispose of what pt reports as "fentanyl" that was found during pt arrival to ED. Pt request that substance be surrendered to police and sts, "I would have surrendered it before if I would have known. I just dont want to go back to jail." Pt with multiple unopened syringe needles as well as opened/used. Due to unknown substance and risk for safety of possible exposure, [employer] called.  ** Initial on scene police department for overdose was [employer], per EMS.  ** Officer Maceado, [employer], to ED for illegal substance surrender requested by pt.

## 2023-01-31 ENCOUNTER — Other Ambulatory Visit: Payer: Self-pay

## 2023-01-31 ENCOUNTER — Emergency Department
Admission: EM | Admit: 2023-01-31 | Discharge: 2023-02-01 | Disposition: A | Discharge status: Home / Self Care | Payer: Self-pay | Attending: Emergency Medicine | Admitting: Emergency Medicine

## 2023-01-31 DIAGNOSIS — T50901A Poisoning by unspecified drugs, medicaments and biological substances, accidental (unintentional), initial encounter: Secondary | ICD-10-CM

## 2023-01-31 DIAGNOSIS — E876 Hypokalemia: Secondary | ICD-10-CM | POA: Diagnosis not present

## 2023-01-31 DIAGNOSIS — T40411A Poisoning by fentanyl or fentanyl analogs, accidental (unintentional), initial encounter: Secondary | ICD-10-CM | POA: Diagnosis present

## 2023-01-31 LAB — CBC WITH DIFFERENTIAL/PLATELET
Abs Immature Granulocytes: 0.02 10*3/uL (ref 0.00–0.07)
Basophils Absolute: 0 10*3/uL (ref 0.0–0.1)
Basophils Relative: 0 %
Eosinophils Absolute: 0.1 10*3/uL (ref 0.0–0.5)
Eosinophils Relative: 1 %
HCT: 36.3 % (ref 36.0–46.0)
Hemoglobin: 11.7 g/dL — ABNORMAL LOW (ref 12.0–15.0)
Immature Granulocytes: 0 %
Lymphocytes Relative: 42 %
Lymphs Abs: 2.4 10*3/uL (ref 0.7–4.0)
MCH: 28.3 pg (ref 26.0–34.0)
MCHC: 32.2 g/dL (ref 30.0–36.0)
MCV: 87.9 fL (ref 80.0–100.0)
Monocytes Absolute: 0.4 10*3/uL (ref 0.1–1.0)
Monocytes Relative: 8 %
Neutro Abs: 2.8 10*3/uL (ref 1.7–7.7)
Neutrophils Relative %: 49 %
Platelets: 194 10*3/uL (ref 150–400)
RBC: 4.13 MIL/uL (ref 3.87–5.11)
RDW: 13.7 % (ref 11.5–15.5)
WBC: 5.7 10*3/uL (ref 4.0–10.5)
nRBC: 0 % (ref 0.0–0.2)

## 2023-01-31 LAB — COMPREHENSIVE METABOLIC PANEL
ALT: 36 U/L (ref 0–44)
AST: 35 U/L (ref 15–41)
Albumin: 3.6 g/dL (ref 3.5–5.0)
Alkaline Phosphatase: 78 U/L (ref 38–126)
Anion gap: 10 (ref 5–15)
BUN: 18 mg/dL (ref 6–20)
CO2: 26 mmol/L (ref 22–32)
Calcium: 8.7 mg/dL — ABNORMAL LOW (ref 8.9–10.3)
Chloride: 99 mmol/L (ref 98–111)
Creatinine, Ser: 0.73 mg/dL (ref 0.44–1.00)
GFR, Estimated: >60 mL/min (ref >60)
Glucose, Bld: 153 mg/dL — ABNORMAL HIGH (ref 70–99)
Potassium: 3.2 mmol/L — ABNORMAL LOW (ref 3.5–5.1)
Sodium: 135 mmol/L (ref 135–145)
Total Bilirubin: 0.8 mg/dL (ref 0.3–1.2)
Total Protein: 7 g/dL (ref 6.5–8.1)

## 2023-01-31 LAB — LIPASE, BLOOD: Lipase: 28 U/L (ref 11–51)

## 2023-01-31 MED ORDER — LACTATED RINGERS IV BOLUS
1000.0000 mL | Freq: Once | INTRAVENOUS | Status: AC
  Administered 2023-01-31: 1000 mL via INTRAVENOUS

## 2023-01-31 NOTE — Discharge Instructions (Signed)
You were seen in the emergency department today for drug overdose.  Laboratory workup otherwise reassuring.  Please follow-up with your primary care provider.  Once again recommending abstinence from all recreational drugs as they can be life-threatening in the setting of overdose.

## 2023-01-31 NOTE — ED Triage Notes (Signed)
Pt from [employer] parking lot via EMS for being unresponsive after unknown amount of fentanyl smoked. Regular user. Pt responsive upon EMS arrival, A&Ox4 upon arrival to ER. Slow of speaking at this time. Calm and compliant at this hand.  EMS Vitals: CBG 180 95% RA 50 end tidal BP 132/83 NSR 110
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
## 2023-02-01 NOTE — ED Provider Notes (Signed)
-----------------------------------------   3:20 AM on 02/01/2023 -----------------------------------------   Awake, alert, ambulatory with steady gait to use the restroom.  Strict return precautions given.  Patient verbalizes understanding and agrees with plan of care.   Irean Hong, MD 02/01/23 [phone]

## 2023-06-09 ENCOUNTER — Other Ambulatory Visit: Payer: Self-pay

## 2023-06-09 ENCOUNTER — Emergency Department
Admission: EM | Admit: 2023-06-09 | Discharge: 2023-06-10 | Disposition: A | Discharge status: Home / Self Care | Payer: Self-pay | Attending: Emergency Medicine | Admitting: Emergency Medicine

## 2023-06-09 DIAGNOSIS — R45851 Suicidal ideations: Secondary | ICD-10-CM | POA: Diagnosis not present

## 2023-06-09 DIAGNOSIS — F1910 Other psychoactive substance abuse, uncomplicated: Secondary | ICD-10-CM

## 2023-06-09 LAB — COMPREHENSIVE METABOLIC PANEL
ALT: 44 U/L (ref 0–44)
AST: 33 U/L (ref 15–41)
Albumin: 3.7 g/dL (ref 3.5–5.0)
Alkaline Phosphatase: 106 U/L (ref 38–126)
Anion gap: 6 (ref 5–15)
BUN: 13 mg/dL (ref 6–20)
CO2: 29 mmol/L (ref 22–32)
Calcium: 8.9 mg/dL (ref 8.9–10.3)
Chloride: 103 mmol/L (ref 98–111)
Creatinine, Ser: 0.58 mg/dL (ref 0.44–1.00)
GFR, Estimated: >60 mL/min (ref >60)
Glucose, Bld: 127 mg/dL — ABNORMAL HIGH (ref 70–99)
Potassium: 3.9 mmol/L (ref 3.5–5.1)
Sodium: 138 mmol/L (ref 135–145)
Total Bilirubin: 0.6 mg/dL (ref 0.0–1.2)
Total Protein: 7.6 g/dL (ref 6.5–8.1)

## 2023-06-09 LAB — CBC
HCT: 39.4 % (ref 36.0–46.0)
Hemoglobin: 12.6 g/dL (ref 12.0–15.0)
MCH: 29 pg (ref 26.0–34.0)
MCHC: 32 g/dL (ref 30.0–36.0)
MCV: 90.8 fL (ref 80.0–100.0)
Platelets: 259 10*3/uL (ref 150–400)
RBC: 4.34 MIL/uL (ref 3.87–5.11)
RDW: 13.6 % (ref 11.5–15.5)
WBC: 6.3 10*3/uL (ref 4.0–10.5)
nRBC: 0 % (ref 0.0–0.2)

## 2023-06-09 LAB — URINE DRUG SCREEN, QUALITATIVE (ARMC ONLY)
Amphetamines, Ur Screen: POSITIVE — AB
Barbiturates, Ur Screen: NOT DETECTED
Benzodiazepine, Ur Scrn: NOT DETECTED
Cannabinoid 50 Ng, Ur ~~LOC~~: NOT DETECTED
Cocaine Metabolite,Ur ~~LOC~~: POSITIVE — AB
MDMA (Ecstasy)Ur Screen: NOT DETECTED
Methadone Scn, Ur: NOT DETECTED
Opiate, Ur Screen: NOT DETECTED
Phencyclidine (PCP) Ur S: NOT DETECTED
Tricyclic, Ur Screen: NOT DETECTED

## 2023-06-09 LAB — ACETAMINOPHEN LEVEL: Acetaminophen (Tylenol), Serum: <10 ug/mL — ABNORMAL LOW (ref 10–30)

## 2023-06-09 LAB — ETHANOL: Alcohol, Ethyl (B): <10 mg/dL (ref <10)

## 2023-06-09 LAB — POC URINE PREG, ED: Preg Test, Ur: NEGATIVE

## 2023-06-09 LAB — SALICYLATE LEVEL: Salicylate Lvl: <7 mg/dL — ABNORMAL LOW (ref 7.0–30.0)

## 2023-06-09 NOTE — ED Notes (Addendum)
Pt given ice cream

## 2023-06-09 NOTE — BH Assessment (Signed)
TTS unable to complete consult at this time. Patient unable to fully participate in the interview.

## 2023-06-09 NOTE — ED Triage Notes (Signed)
Pt arrived via [employer] to the ED with IVC papers. [place]. Pt has been SI and has wanted to hurt herself with fentanyl. Pt has been having hallucinations of ppl having sex as well as seeing dead people.

## 2023-06-09 NOTE — ED Notes (Signed)
TTS speaking with patient now

## 2023-06-09 NOTE — ED Notes (Signed)
IVC all papers filed pending consult

## 2023-06-09 NOTE — ED Notes (Addendum)
Pt changed into psych safe clothes.  Pt belongings are Black shirt Black pants Grey shoes Grey socks Purple underwear Black bra Black hair tie Silver ring placed in cup

## 2023-06-09 NOTE — ED Provider Notes (Signed)
[facility] Provider Note    Event Date/Time   First MD Initiated Contact with Patient 06/09/23 1540     (approximate)   History   Suicidal   HPI  Emily Hutchinson is a 43 y.o. female who presents to the emergency department today under IVC after [place] and stating that she had wanted to hurt herself.  Per IVC paperwork patient also has a history of schizophrenia and drug abuse.  On my exam patient does admit to having thoughts of SI although not currently.  She denies any recent illness, fevers.     Physical Exam   Triage Vital Signs: ED Triage Vitals [06/09/23 1518]  Encounter Vitals Group     BP (!) 124/90     Systolic BP Percentile      Diastolic BP Percentile      Pulse Rate (!) 149     Resp 17     Temp 98.5 F (36.9 C)     Temp Source Oral     SpO2 100 %     Weight (!) 1315 lb (596.5 kg)     Height 5\' 7"  (1.702 m)     Head Circumference      Peak Flow      Pain Score 0     Pain Loc      Pain Education      Exclude from Growth Chart     Most recent vital signs: Vitals:   06/09/23 1518  BP: (!) 124/90  Pulse: (!) 149  Resp: 17  Temp: 98.5 F (36.9 C)  SpO2: 100%   General: Awake, alert, appears slightly intoxicated CV:  Good peripheral perfusion.  Resp:  Normal effort.  Abd:  No distention.    ED Results / Procedures / Treatments   Labs (all labs ordered are listed, but only abnormal results are displayed) Labs Reviewed  COMPREHENSIVE METABOLIC PANEL - Abnormal; Notable for the following components:      Result Value   Glucose, Bld 127 (*)    All other components within normal limits  SALICYLATE LEVEL - Abnormal; Notable for the following components:   Salicylate Lvl <7.0 (*)    All other components within normal limits  ACETAMINOPHEN LEVEL - Abnormal; Notable for the following components:   Acetaminophen (Tylenol), Serum <10 (*)    All other components within normal limits  URINE DRUG  SCREEN, QUALITATIVE ([facility] ONLY) - Abnormal; Notable for the following components:   Amphetamines, Ur Screen POSITIVE (*)    Cocaine Metabolite,Ur [place] POSITIVE (*)    All other components within normal limits  ETHANOL  CBC  POC URINE PREG, ED     EKG  None   RADIOLOGY None   PROCEDURES:  Critical Care performed: No   MEDICATIONS ORDERED IN ED: Medications - No data to display   IMPRESSION / MDM / ASSESSMENT AND PLAN / ED COURSE  I reviewed the triage vital signs and the nursing notes.                              Differential diagnosis includes, but is not limited to, polysubstance abuse, SI, psychiatric illness  Patient's presentation is most consistent with acute presentation with potential threat to life or bodily function.   Patient presents to the emergency department today under patient does admit positive for multiple substances.  At this time will continue IVC and have psychiatry  evaluate.  The patient has been placed in psychiatric observation due to the need to provide a safe environment for the patient while obtaining psychiatric consultation and evaluation, as well as ongoing medical and medication management to treat the patient's condition.  The patient has been placed under full IVC at this time.    FINAL CLINICAL IMPRESSION(S) / ED DIAGNOSES   Final diagnoses:  Suicidal ideation  Polysubstance abuse (HCC)      Note:  This document was prepared using Dragon voice recognition software and may include unintentional dictation errors.    Phineas Semen, MD 06/09/23 1700

## 2023-06-10 DIAGNOSIS — F1910 Other psychoactive substance abuse, uncomplicated: Secondary | ICD-10-CM

## 2023-06-10 NOTE — ED Notes (Signed)
Pt came to nursing station door and requested water and breakfast.  RN provided pt with water and explained meal times to pt.  Pt began speaking with raised voice and asked "what do I have to do to get out of here?"  RN explained plan of care to patient.  Pt continued speaking in a raised voice and stated "I am coming off of dope."  Pt returned to room and is currently lying in bed.
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
## 2023-06-10 NOTE — ED Notes (Signed)
pt recieved snack and drink 

## 2023-06-10 NOTE — BH Assessment (Signed)
Psych unable to complete consult at this time. Patient unable to fully participate in the interview.

## 2023-06-10 NOTE — ED Notes (Signed)
Pt's belongings received from locker and given to pt

## 2023-06-10 NOTE — ED Notes (Signed)
Hospital meal provided, pt tolerated w/o complaints.  Waste discarded appropriately.  

## 2023-06-10 NOTE — ED Notes (Signed)
Breakfast tray and apple juice provided; Pt tolerated well

## 2023-06-10 NOTE — ED Notes (Signed)
All patient's belongings returned in preparation for discharge.

## 2023-06-10 NOTE — Consult Note (Cosign Needed)
[facility] Psychiatric Consult Initial  Patient Name: .Emily Hutchinson  MRN: [id number]  DOB: November 08, 1980  Consult Order details:  Orders (From admission, onward)     Start     Ordered   06/09/23 1652  IP CONSULT TO PSYCHIATRY       Ordering Provider: Phineas Semen, MD  Provider:  (Not yet assigned)  Question Answer Comment  Place call to: psychiatry   Reason for Consult Consult   Diagnosis/Clinical Info for Consult: ivc      06/09/23 1651   06/09/23 1521  CONSULT TO CALL ACT TEAM       Ordering Provider: Phineas Semen, MD  Provider:  (Not yet assigned)  Question:  Reason for Consult?  Answer:  SI   06/09/23 1520             Mode of Visit: Tele-visit Virtual Statement:TELE PSYCHIATRY ATTESTATION & CONSENT As the provider for this telehealth consult, I attest that I verified the patient's identity using two separate identifiers, introduced myself to the patient, provided my credentials, disclosed my location, and performed this encounter via a HIPAA-compliant, real-time, face-to-face, two-way, interactive audio and video platform and with the full consent and agreement of the patient (or guardian as applicable.) Patient physical location: [facility] ED. Telehealth provider physical location: home office in state of [place].   Video start time: 10:30 AM Video end time: 11:00 AM    Psychiatry Consult Evaluation  Service Date: June 10, 2023 LOS:  LOS: 0 days  Chief Complaint "I just want to go. I am not suicidal"  Primary Psychiatric Diagnoses  Polysubstance use disorder   Assessment  Emily Hutchinson is a 43 y.o. female presenting to [facility] ED on 06/09/2023  3:33 PM for suicidal ideations and substance use. Patient denies current suicidal ideations, stating that she experienced SI "years ago. They stretched my words. I do not want to hurt myself". Patient reports that walking on that train track is common so that she can avoid the dangers of traffic on the road. "I walk on the train track  because it is a clear shot to where I go. I can hear if a train is coming". 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 She denies currently being established with a community psychiatric provider but states that she received therapy and medication management while in prison. She is not open to restarting psychotropic medications or receiving mental health treatment today, stating "no, I don't need help. I didn't take my medications half the time when I was in prison". Patient reports that she does not have the desire for cessation of illicit substance use at this time and declined the need for substance abuse treatment resources. Patient is calm, pleasant and willing to engage. She reports an okay mood, affect congruent. Patient is noted sitting on the bed, wrapped in a sheet, complaining of being hot. Patient reports illicit substance use prior to admission and her symptom of being "hot" is likely an adverse effect of amphetamine use. Patient admitted to using "meth". UDS completed. Patient UDS is positive for amphetamines and cocaine. Patient also reports using crack cocaine, heroin, and fentanyl. She reports being homeless, but states she plans to stay with a friend when she leaves to avoid contact with her ex-boyfriend, who causes her "stress. He doesn't realize that we shouldn't be together".   She denies SI/HI/AVH/delusional thought/paranoia.  Please see  plan below for detailed recommendations.   Diagnoses:  Active Hospital problems: Active Problems:   Polysubstance abuse (HCC)    Plan   ## Psychiatric Medication Recommendations:  None at this time  ## Medical Decision Making Capacity:  Patient does not lack decision making capacity  ## Further Work-up:  -- CBC, CMP, UDS    ## Disposition:-- There are no psychiatric  contraindications to discharge at this time  ## Behavioral / Environmental: - No specific recommendations at this time.     ## Safety and Observation Level:  - Based on my clinical evaluation, I estimate the patient to be at no risk of self harm in the current setting. - At this time, we recommend  routine. This decision is based on my review of the chart including patient's history and current presentation, interview of the patient, mental status examination, and consideration of suicide risk including evaluating suicidal ideation, plan, intent, suicidal or self-harm behaviors, risk factors, and protective factors. This judgment is based on our ability to directly address suicide risk, implement suicide prevention strategies, and develop a safety plan while the patient is in the clinical setting. Please contact our team if there is a concern that risk level has changed.  CSSR Risk Category:C-SSRS RISK CATEGORY: No Risk  Suicide Risk Assessment:  Patient has the following protective factors against suicide: Supportive friends  Thank you for this consult request. Recommendations have been communicated to the primary team. Patient is psychiatrically cleared at this time.   Mcneil Sober, NP       History of Present Illness  Relevant Aspects of Hospital ED Course:  Admitted on 06/09/2023 for report of suicidal ideations.   Patient Report:  "I do not want to hurt myself"  Psych ROS:  Depression: denies Anxiety:  denies Mania (lifetime and current): denies Psychosis: (lifetime and current): hx of substance induced    Review of Systems  Psychiatric/Behavioral:  Positive for substance abuse.   All other systems reviewed and are negative.    Psychiatric and Social History  Psychiatric History:  Information collected from patient, medical record, staff  Prev Dx/Sx: Polysubstance use disorder, MDD Current Psych Provider: denies Home Meds (current): denies Previous Med Trials: yes.  unable to recall names Therapy: yes  Prior Psych Hospitalization: yes  Prior Self Harm: SIB when younger Prior Violence: denies  Family Psych History: denies Family Hx suicide: denies  Social History:  Developmental Hx: WNL Educational Hx:  Occupational Hx: denies Legal Hx: denies Living Situation: homeless Spiritual Hx: n/a Access to weapons/lethal means: no   Substance History Alcohol: yes  History of alcohol withdrawal seizures no History of DT's no Tobacco: yes Illicit drugs: yes Prescription drug abuse: denies Rehab hx: yes  Exam Findings  Physical Exam: no abnormalities observed Vital Signs:  Temp:  [98.5 F (36.9 C)-98.7 F (37.1 C)] 98.5 F (36.9 C) (01/18 0802) Pulse Rate:  [68-149] 68 (01/18 0802) Resp:  [16-18] 18 (01/18 0802) BP: (108-136)/(65-90) 136/85 (01/18 0802) SpO2:  [98 %-100 %] 98 % (01/18 0802) Weight:  [596.5 kg] 596.5 kg (01/17 1518) Blood pressure 136/85, pulse 68, temperature 98.5 F (36.9 C), temperature source Oral, resp. rate 18, height 5\' 7"  (1.702 m), weight (!) 596.5 kg, last menstrual period 05/31/2023, SpO2 98%. Body mass index is 205.96 kg/m.  Physical Exam Vitals and nursing note reviewed.  Neurological:     General: No focal deficit present.     Mental Status: She is oriented to person, place, and time.  Psychiatric:        Mood and Affect: Mood normal.        Behavior: Behavior normal.        Thought Content: Thought content normal.        Judgment: Judgment normal.     Mental Status Exam: General Appearance: Disheveled  Orientation:  Full (Time, Place, and Person)  Memory:  Immediate;   Good Recent;   Good Remote;   Good  Concentration:  Concentration: Good and Attention Span: Good  Recall:  Good  Attention  Good  Eye Contact:  Good  Speech:  Clear and Coherent  Language:  Good  Volume:  Normal  Mood: okay  Affect:  Congruent  Thought Process:  Coherent  Thought Content:  Logical  Suicidal Thoughts:  No   Homicidal Thoughts:  No  Judgement:  Good  Insight:  Good  Psychomotor Activity:  Normal  Akathisia:  No  Fund of Knowledge:  Good      Assets:  Manufacturing systems engineer Social Support  Cognition:  WNL  ADL's:  Intact  AIMS (if indicated):  n/a     Other History   These have been pulled in through the EMR, reviewed, and updated if appropriate.  Family History:  The patient's family history is not on file.  Medical History: Past Medical History:  Diagnosis Date   Anxiety and depression    IV drug user     Surgical History: Past Surgical History:  Procedure Laterality Date   CESAREAN SECTION     CHOLECYSTECTOMY       Medications:  No current facility-administered medications for this encounter.  Current Outpatient Medications:    acetaminophen (TYLENOL) 325 MG tablet, Take 2 tablets (650 mg total) by mouth every 6 (six) hours as needed for mild pain., Disp: , Rfl:    FLUoxetine (PROZAC) 20 MG capsule, Take 1 capsule (20 mg total) by mouth at bedtime. (Patient not taking: Reported on 03/10/2018), Disp: 30 capsule, Rfl: 1   hydrOXYzine (ATARAX/VISTARIL) 25 MG tablet, Take 1 tablet (25 mg total) by mouth 3 (three) times daily as needed for anxiety. (Patient not taking: Reported on 03/10/2018), Disp: 90 tablet, Rfl: 1   meloxicam (MOBIC) 15 MG tablet, Take 1 tablet (15 mg total) by mouth daily. (Patient not taking: Reported on 06/10/2023), Disp: 30 tablet, Rfl: 0   methocarbamol (ROBAXIN) 500 MG tablet, Take 1 tablet (500 mg total) by mouth every 8 (eight) hours as needed for muscle spasms. (Patient not taking: Reported on 06/10/2023), Disp: 60 tablet, Rfl: 0   naloxone (NARCAN) nasal spray 4 mg/0.1 mL, Use as directed, Disp: 1 each, Rfl: 1   oxyCODONE (OXY IR/ROXICODONE) 5 MG immediate release tablet, Take 1 tablet (5 mg total) by mouth every 4 (four) hours as needed for severe pain., Disp: 20 tablet, Rfl: 0   traZODone (DESYREL) 100 MG tablet, Take 1 tablet (100 mg total) by  mouth at bedtime. (Patient not taking: Reported on 03/10/2018), Disp: 30 tablet, Rfl: 1  Allergies: Allergies  Allergen Reactions   Penicillins     Mcneil Sober, NP

## 2023-06-10 NOTE — ED Notes (Signed)
IVC/ Consult still pending  

## 2023-06-10 NOTE — ED Notes (Signed)
Morning snack of pineapple and water provided; Pt sleeping, left in room

## 2023-08-07 ENCOUNTER — Inpatient Hospital Stay: Payer: Self-pay | Admitting: Anesthesiology

## 2023-08-07 ENCOUNTER — Emergency Department: Payer: MEDICAID

## 2023-08-07 ENCOUNTER — Encounter
Admission: EM | Disposition: A | Discharge status: Home / Self Care | Payer: Self-pay | Source: Home / Self Care | Attending: Hospitalist

## 2023-08-07 ENCOUNTER — Inpatient Hospital Stay: Payer: MEDICAID | Admitting: Anesthesiology

## 2023-08-07 ENCOUNTER — Inpatient Hospital Stay
Admission: EM | Admit: 2023-08-07 | Discharge: 2023-08-14 | DRG: 580 | Disposition: A | Discharge status: Home / Self Care | Payer: MEDICAID | Attending: Internal Medicine | Admitting: Internal Medicine

## 2023-08-07 ENCOUNTER — Other Ambulatory Visit: Payer: Self-pay

## 2023-08-07 DIAGNOSIS — F17290 Nicotine dependence, other tobacco product, uncomplicated: Secondary | ICD-10-CM | POA: Diagnosis present

## 2023-08-07 DIAGNOSIS — A4902 Methicillin resistant Staphylococcus aureus infection, unspecified site: Secondary | ICD-10-CM | POA: Diagnosis not present

## 2023-08-07 DIAGNOSIS — F17210 Nicotine dependence, cigarettes, uncomplicated: Secondary | ICD-10-CM | POA: Diagnosis present

## 2023-08-07 DIAGNOSIS — M65941 Unspecified synovitis and tenosynovitis, right hand: Secondary | ICD-10-CM | POA: Diagnosis present

## 2023-08-07 DIAGNOSIS — F1110 Opioid abuse, uncomplicated: Secondary | ICD-10-CM | POA: Diagnosis present

## 2023-08-07 DIAGNOSIS — Z604 Social exclusion and rejection: Secondary | ICD-10-CM | POA: Diagnosis present

## 2023-08-07 DIAGNOSIS — Z5941 Food insecurity: Secondary | ICD-10-CM | POA: Diagnosis not present

## 2023-08-07 DIAGNOSIS — F1410 Cocaine abuse, uncomplicated: Secondary | ICD-10-CM | POA: Diagnosis present

## 2023-08-07 DIAGNOSIS — R7401 Elevation of levels of liver transaminase levels: Secondary | ICD-10-CM | POA: Diagnosis not present

## 2023-08-07 DIAGNOSIS — L0390 Cellulitis, unspecified: Secondary | ICD-10-CM | POA: Diagnosis present

## 2023-08-07 DIAGNOSIS — F1910 Other psychoactive substance abuse, uncomplicated: Secondary | ICD-10-CM | POA: Diagnosis present

## 2023-08-07 DIAGNOSIS — I96 Gangrene, not elsewhere classified: Secondary | ICD-10-CM | POA: Diagnosis present

## 2023-08-07 DIAGNOSIS — Z79899 Other long term (current) drug therapy: Secondary | ICD-10-CM

## 2023-08-07 DIAGNOSIS — Z880 Allergy status to penicillin: Secondary | ICD-10-CM | POA: Diagnosis not present

## 2023-08-07 DIAGNOSIS — Z5982 Transportation insecurity: Secondary | ICD-10-CM | POA: Diagnosis not present

## 2023-08-07 DIAGNOSIS — F1510 Other stimulant abuse, uncomplicated: Secondary | ICD-10-CM | POA: Diagnosis present

## 2023-08-07 DIAGNOSIS — L02511 Cutaneous abscess of right hand: Secondary | ICD-10-CM | POA: Diagnosis present

## 2023-08-07 DIAGNOSIS — M65141 Other infective (teno)synovitis, right hand: Secondary | ICD-10-CM | POA: Diagnosis not present

## 2023-08-07 DIAGNOSIS — B9562 Methicillin resistant Staphylococcus aureus infection as the cause of diseases classified elsewhere: Secondary | ICD-10-CM | POA: Diagnosis present

## 2023-08-07 DIAGNOSIS — Z205 Contact with and (suspected) exposure to viral hepatitis: Secondary | ICD-10-CM | POA: Diagnosis present

## 2023-08-07 DIAGNOSIS — L03113 Cellulitis of right upper limb: Principal | ICD-10-CM | POA: Diagnosis present

## 2023-08-07 DIAGNOSIS — M7989 Other specified soft tissue disorders: Secondary | ICD-10-CM

## 2023-08-07 HISTORY — PX: INCISION AND DRAINAGE OF WOUND: SHX1803

## 2023-08-07 LAB — COMPREHENSIVE METABOLIC PANEL
ALT: 156 U/L — ABNORMAL HIGH (ref 0–44)
AST: 236 U/L — ABNORMAL HIGH (ref 15–41)
Albumin: 3.3 g/dL — ABNORMAL LOW (ref 3.5–5.0)
Alkaline Phosphatase: 206 U/L — ABNORMAL HIGH (ref 38–126)
Anion gap: 9 (ref 5–15)
BUN: 9 mg/dL (ref 6–20)
CO2: 24 mmol/L (ref 22–32)
Calcium: 8.4 mg/dL — ABNORMAL LOW (ref 8.9–10.3)
Chloride: 100 mmol/L (ref 98–111)
Creatinine, Ser: 0.67 mg/dL (ref 0.44–1.00)
GFR, Estimated: >60 mL/min (ref >60)
Glucose, Bld: 97 mg/dL (ref 70–99)
Potassium: 3.2 mmol/L — ABNORMAL LOW (ref 3.5–5.1)
Sodium: 133 mmol/L — ABNORMAL LOW (ref 135–145)
Total Bilirubin: 1.1 mg/dL (ref 0.0–1.2)
Total Protein: 6.7 g/dL (ref 6.5–8.1)

## 2023-08-07 LAB — LACTIC ACID, PLASMA
Lactic Acid, Venous: 0.9 mmol/L (ref 0.5–1.9)
Lactic Acid, Venous: 0.6 mmol/L (ref 0.5–1.9)

## 2023-08-07 LAB — HIV ANTIBODY (ROUTINE TESTING W REFLEX): HIV Screen 4th Generation wRfx: NONREACTIVE

## 2023-08-07 LAB — URINE DRUG SCREEN, QUALITATIVE (ARMC ONLY)
Amphetamines, Ur Screen: POSITIVE — AB
Barbiturates, Ur Screen: NOT DETECTED
Benzodiazepine, Ur Scrn: NOT DETECTED
Cannabinoid 50 Ng, Ur ~~LOC~~: NOT DETECTED
Cocaine Metabolite,Ur ~~LOC~~: NOT DETECTED
MDMA (Ecstasy)Ur Screen: NOT DETECTED
Methadone Scn, Ur: NOT DETECTED
Opiate, Ur Screen: NOT DETECTED
Phencyclidine (PCP) Ur S: NOT DETECTED
Tricyclic, Ur Screen: NOT DETECTED

## 2023-08-07 LAB — CBC WITH DIFFERENTIAL/PLATELET
Abs Immature Granulocytes: 0.04 10*3/uL (ref 0.00–0.07)
Basophils Absolute: 0 10*3/uL (ref 0.0–0.1)
Basophils Relative: 0 %
Eosinophils Absolute: 0.1 10*3/uL (ref 0.0–0.5)
Eosinophils Relative: 1 %
HCT: 34.1 % — ABNORMAL LOW (ref 36.0–46.0)
Hemoglobin: 11.4 g/dL — ABNORMAL LOW (ref 12.0–15.0)
Immature Granulocytes: 0 %
Lymphocytes Relative: 14 %
Lymphs Abs: 1.3 10*3/uL (ref 0.7–4.0)
MCH: 29.5 pg (ref 26.0–34.0)
MCHC: 33.4 g/dL (ref 30.0–36.0)
MCV: 88.1 fL (ref 80.0–100.0)
Monocytes Absolute: 0.8 10*3/uL (ref 0.1–1.0)
Monocytes Relative: 9 %
Neutro Abs: 6.7 10*3/uL (ref 1.7–7.7)
Neutrophils Relative %: 76 %
Platelets: 184 10*3/uL (ref 150–400)
RBC: 3.87 MIL/uL (ref 3.87–5.11)
RDW: 14.2 % (ref 11.5–15.5)
WBC: 8.9 10*3/uL (ref 4.0–10.5)
nRBC: 0 % (ref 0.0–0.2)

## 2023-08-07 LAB — HEPATITIS PANEL, ACUTE
HCV Ab: REACTIVE — AB
Hep A IgM: NONREACTIVE
Hep B C IgM: NONREACTIVE
Hepatitis B Surface Ag: NONREACTIVE

## 2023-08-07 LAB — ETHANOL: Alcohol, Ethyl (B): <10 mg/dL (ref <10)

## 2023-08-07 LAB — PREGNANCY, URINE: Preg Test, Ur: NEGATIVE

## 2023-08-07 LAB — ACETAMINOPHEN LEVEL: Acetaminophen (Tylenol), Serum: <10 ug/mL — ABNORMAL LOW (ref 10–30)

## 2023-08-07 SURGERY — IRRIGATION AND DEBRIDEMENT WOUND
Anesthesia: General | Laterality: Right

## 2023-08-07 MED ORDER — DEXAMETHASONE SODIUM PHOSPHATE 10 MG/ML IJ SOLN
INTRAMUSCULAR | Status: DC | PRN
  Administered 2023-08-07: 8 mg via INTRAVENOUS

## 2023-08-07 MED ORDER — MIDAZOLAM HCL 2 MG/2ML IJ SOLN
INTRAMUSCULAR | Status: DC | PRN
  Administered 2023-08-07: 2 mg via INTRAVENOUS

## 2023-08-07 MED ORDER — PROPOFOL 1000 MG/100ML IV EMUL
INTRAVENOUS | Status: AC
  Filled 2023-08-07: qty 100

## 2023-08-07 MED ORDER — 0.9 % SODIUM CHLORIDE (POUR BTL) OPTIME
TOPICAL | Status: DC | PRN
  Administered 2023-08-07: 1000 mL

## 2023-08-07 MED ORDER — FENTANYL CITRATE (PF) 100 MCG/2ML IJ SOLN
INTRAMUSCULAR | Status: DC | PRN
  Administered 2023-08-07: 100 ug via INTRAVENOUS

## 2023-08-07 MED ORDER — METRONIDAZOLE 500 MG/100ML IV SOLN
500.0000 mg | Freq: Once | INTRAVENOUS | Status: AC
  Administered 2023-08-07: 500 mg via INTRAVENOUS
  Filled 2023-08-07: qty 100

## 2023-08-07 MED ORDER — SODIUM CHLORIDE 0.9 % IV SOLN
2.0000 g | Freq: Three times a day (TID) | INTRAVENOUS | Status: DC
  Administered 2023-08-07 – 2023-08-08 (×3): 2 g via INTRAVENOUS
  Filled 2023-08-07 (×5): qty 12.5

## 2023-08-07 MED ORDER — PROPOFOL 500 MG/50ML IV EMUL
INTRAVENOUS | Status: DC | PRN
  Administered 2023-08-07: 50 ug/kg/min via INTRAVENOUS
  Administered 2023-08-07: 200 ug/kg/min via INTRAVENOUS

## 2023-08-07 MED ORDER — OXYCODONE HCL 5 MG/5ML PO SOLN: 5.0000 mg | Freq: Once | ORAL | Status: DC | PRN

## 2023-08-07 MED ORDER — BUPIVACAINE HCL (PF) 0.5 % IJ SOLN
INTRAMUSCULAR | Status: AC
  Filled 2023-08-07: qty 30

## 2023-08-07 MED ORDER — ONDANSETRON HCL 4 MG/2ML IJ SOLN
INTRAMUSCULAR | Status: AC
  Filled 2023-08-07: qty 2

## 2023-08-07 MED ORDER — ONDANSETRON HCL 4 MG PO TABS: 4.0000 mg | ORAL_TABLET | Freq: Four times a day (QID) | ORAL | Status: DC | PRN

## 2023-08-07 MED ORDER — FENTANYL CITRATE (PF) 100 MCG/2ML IJ SOLN
INTRAMUSCULAR | Status: AC
  Filled 2023-08-07: qty 2

## 2023-08-07 MED ORDER — DEXMEDETOMIDINE HCL IN NACL 80 MCG/20ML IV SOLN
INTRAVENOUS | Status: DC | PRN
  Administered 2023-08-07: 12 ug via INTRAVENOUS
  Administered 2023-08-07 (×2): 8 ug via INTRAVENOUS
  Administered 2023-08-07: 12 ug via INTRAVENOUS

## 2023-08-07 MED ORDER — ENOXAPARIN SODIUM 40 MG/0.4ML IJ SOSY
40.0000 mg | PREFILLED_SYRINGE | INTRAMUSCULAR | Status: DC
  Administered 2023-08-08 – 2023-08-14 (×7): 40 mg via SUBCUTANEOUS
  Filled 2023-08-07 (×7): qty 0.4

## 2023-08-07 MED ORDER — ONDANSETRON HCL 4 MG/2ML IJ SOLN
4.0000 mg | Freq: Four times a day (QID) | INTRAMUSCULAR | Status: DC | PRN
Start: 2023-08-07 — End: 2023-08-14
  Administered 2023-08-07: 4 mg via INTRAVENOUS

## 2023-08-07 MED ORDER — FENTANYL CITRATE (PF) 100 MCG/2ML IJ SOLN: 25.0000 ug | INTRAMUSCULAR | Status: DC | PRN

## 2023-08-07 MED ORDER — ACETAMINOPHEN 10 MG/ML IV SOLN
INTRAVENOUS | Status: DC | PRN
  Administered 2023-08-07: 1000 mg via INTRAVENOUS

## 2023-08-07 MED ORDER — VANCOMYCIN HCL IN DEXTROSE 1-5 GM/200ML-% IV SOLN
1000.0000 mg | Freq: Once | INTRAVENOUS | Status: AC
  Administered 2023-08-07: 1000 mg via INTRAVENOUS
  Filled 2023-08-07: qty 200

## 2023-08-07 MED ORDER — KETAMINE HCL 50 MG/5ML IJ SOSY
PREFILLED_SYRINGE | INTRAMUSCULAR | Status: AC
  Filled 2023-08-07: qty 5

## 2023-08-07 MED ORDER — VANCOMYCIN HCL 750 MG/150ML IV SOLN
750.0000 mg | Freq: Once | INTRAVENOUS | Status: DC
  Filled 2023-08-07: qty 150

## 2023-08-07 MED ORDER — ACETAMINOPHEN 10 MG/ML IV SOLN
INTRAVENOUS | Status: AC
  Filled 2023-08-07: qty 100

## 2023-08-07 MED ORDER — IOHEXOL 300 MG/ML  SOLN
75.0000 mL | Freq: Once | INTRAMUSCULAR | Status: AC | PRN
  Administered 2023-08-07: 75 mL via INTRAVENOUS

## 2023-08-07 MED ORDER — LIDOCAINE HCL (CARDIAC) PF 100 MG/5ML IV SOSY
PREFILLED_SYRINGE | INTRAVENOUS | Status: DC | PRN
  Administered 2023-08-07: 80 mg via INTRAVENOUS

## 2023-08-07 MED ORDER — VANCOMYCIN HCL IN DEXTROSE 1-5 GM/200ML-% IV SOLN
1000.0000 mg | Freq: Two times a day (BID) | INTRAVENOUS | Status: DC
  Filled 2023-08-07: qty 200

## 2023-08-07 MED ORDER — KETAMINE HCL 10 MG/ML IJ SOLN
INTRAMUSCULAR | Status: DC | PRN
  Administered 2023-08-07: 20 mg via INTRAVENOUS
  Administered 2023-08-07: 10 mg via INTRAVENOUS

## 2023-08-07 MED ORDER — SODIUM CHLORIDE 0.9 % IV BOLUS (SEPSIS)
1000.0000 mL | Freq: Once | INTRAVENOUS | Status: AC
  Administered 2023-08-07: 1000 mL via INTRAVENOUS

## 2023-08-07 MED ORDER — PROPOFOL 10 MG/ML IV BOLUS
INTRAVENOUS | Status: AC
  Filled 2023-08-07: qty 20

## 2023-08-07 MED ORDER — VANCOMYCIN HCL IN DEXTROSE 1-5 GM/200ML-% IV SOLN
1000.0000 mg | Freq: Two times a day (BID) | INTRAVENOUS | Status: DC
Start: 2023-08-07 — End: 2023-08-10
  Administered 2023-08-07 – 2023-08-10 (×6): 1000 mg via INTRAVENOUS
  Filled 2023-08-07 (×8): qty 200

## 2023-08-07 MED ORDER — OXYCODONE HCL 5 MG PO TABS: 5.0000 mg | ORAL_TABLET | Freq: Once | ORAL | Status: DC | PRN

## 2023-08-07 MED ORDER — FENTANYL CITRATE PF 50 MCG/ML IJ SOSY
25.0000 ug | PREFILLED_SYRINGE | INTRAMUSCULAR | Status: DC | PRN
  Administered 2023-08-07 – 2023-08-08 (×3): 25 ug via INTRAVENOUS
  Filled 2023-08-07 (×3): qty 1

## 2023-08-07 MED ORDER — MIDAZOLAM HCL 2 MG/2ML IJ SOLN
INTRAMUSCULAR | Status: AC
  Filled 2023-08-07: qty 2

## 2023-08-07 MED ORDER — FENTANYL CITRATE PF 50 MCG/ML IJ SOSY
50.0000 ug | PREFILLED_SYRINGE | Freq: Once | INTRAMUSCULAR | Status: AC
  Administered 2023-08-07: 50 ug via INTRAVENOUS
  Filled 2023-08-07: qty 1

## 2023-08-07 MED ORDER — SODIUM CHLORIDE 0.9 % IV SOLN
2.0000 g | Freq: Once | INTRAVENOUS | Status: AC
  Administered 2023-08-07: 2 g via INTRAVENOUS
  Filled 2023-08-07: qty 12.5

## 2023-08-07 MED ORDER — LACTATED RINGERS IV SOLN: INTRAVENOUS | Status: AC

## 2023-08-07 SURGICAL SUPPLY — 40 items
BLADE SURG SZ10 CARB STEEL (BLADE) ×1 IMPLANT
BNDG COHESIVE 4X5 TAN STRL LF (GAUZE/BANDAGES/DRESSINGS) ×1 IMPLANT
BNDG ELASTIC 4INX 5YD STR LF (GAUZE/BANDAGES/DRESSINGS) ×1 IMPLANT
BNDG ESMARCH 4X12 STRL LF (GAUZE/BANDAGES/DRESSINGS) ×1 IMPLANT
BNDG GAUZE DERMACEA FLUFF 4 (GAUZE/BANDAGES/DRESSINGS) IMPLANT
CATH IV ANGIO 16GX3.25 GREY (CATHETERS) IMPLANT
CHLORAPREP W/TINT 26 (MISCELLANEOUS) ×1 IMPLANT
CNTNR URN SCR LID CUP LEK RST (MISCELLANEOUS) ×1 IMPLANT
CUFF TOURN SGL QUICK 18X4 (TOURNIQUET CUFF) IMPLANT
CUFF TRNQT CYL 24X4X16.5-23 (TOURNIQUET CUFF) IMPLANT
DRAIN PENROSE 12X.25 LTX STRL (MISCELLANEOUS) IMPLANT
ELECT REM PT RETURN 9FT ADLT (ELECTROSURGICAL) ×1 IMPLANT
ELECTRODE REM PT RTRN 9FT ADLT (ELECTROSURGICAL) ×1 IMPLANT
GAUZE SPONGE 4X4 12PLY STRL (GAUZE/BANDAGES/DRESSINGS) IMPLANT
GAUZE XEROFORM 1X8 LF (GAUZE/BANDAGES/DRESSINGS) IMPLANT
GLOVE BIO SURGEON STRL SZ8 (GLOVE) ×1 IMPLANT
GLOVE INDICATOR 8.0 STRL GRN (GLOVE) ×1 IMPLANT
GLOVE SURG ORTHO 8.5 STRL (GLOVE) ×1 IMPLANT
GOWN STRL REUS W/ TWL LRG LVL3 (GOWN DISPOSABLE) ×1 IMPLANT
GOWN STRL REUS W/ TWL XL LVL3 (GOWN DISPOSABLE) ×1 IMPLANT
IV CATH ANGIO 16GX3.25 GREY (CATHETERS) ×1 IMPLANT
KIT TURNOVER KIT A (KITS) ×1 IMPLANT
LABEL OR SOLS (LABEL) ×1 IMPLANT
MANIFOLD NEPTUNE II (INSTRUMENTS) ×1 IMPLANT
NDL SAFETY ECLIPSE 18X1.5 (NEEDLE) ×1 IMPLANT
NS IRRIG 1000ML POUR BTL (IV SOLUTION) ×1 IMPLANT
PACK EXTREMITY ARMC (MISCELLANEOUS) ×1 IMPLANT
PAD ABD DERMACEA PRESS 5X9 (GAUZE/BANDAGES/DRESSINGS) IMPLANT
PAD CAST 4YDX4 CTTN HI CHSV (CAST SUPPLIES) ×1 IMPLANT
PADDING CAST BLEND 4X4 STRL (MISCELLANEOUS) ×1 IMPLANT
SPLINT CAST 1 STEP 3X12 (MISCELLANEOUS) ×1 IMPLANT
SPONGE T-LAP 18X18 ~~LOC~~+RFID (SPONGE) ×1 IMPLANT
STAPLER SKIN PROX 35W (STAPLE) ×1 IMPLANT
STOCKINETTE BIAS CUT 4 980044 (GAUZE/BANDAGES/DRESSINGS) ×1 IMPLANT
STOCKINETTE IMPERVIOUS 9X36 MD (GAUZE/BANDAGES/DRESSINGS) ×1 IMPLANT
SUT PROLENE 4 0 PS 2 18 (SUTURE) ×2 IMPLANT
SWAB CULTURE AMIES ANAERIB BLU (MISCELLANEOUS) ×1 IMPLANT
SYR 10ML LL (SYRINGE) ×1 IMPLANT
TRAP FLUID SMOKE EVACUATOR (MISCELLANEOUS) ×1 IMPLANT
WATER STERILE IRR 500ML POUR (IV SOLUTION) ×1 IMPLANT

## 2023-08-07 NOTE — Consult Note (Signed)
 Pharmacy Antibiotic Note  Emily Hutchinson is a 43 y.o. female admitted on 08/07/2023 with cellulitis. Patient presented to ED with right hand swelling.  Patient does have history of IVDU, but denies injection into hand. Pharmacy has been consulted for vancomycin and cefepime dosing.  Antibiotics received in ED: Vancomycin 1 gram IV x 1 and cefepime 2 grams IV x 1  Plan: Give vancomycin 750 mg IV x 1 to complete loading dose of 1750 mg, then start vancomycin 1 g IV every 12 hours Estimated AUC 471.5, Cmin 12.8 IBW, Scr rounded to 0.8, Vd 0.72 Vancomycin levels at steady state or as clinically indicated Start cefepime 2 grams IV every 8 hours Follow renal function and cultures for adjustments  Height: 5\' 7"  (170.2 cm) Weight: 75.2 kg (165 lb 12.6 oz) IBW/kg (Calculated) : 61.6  Temp (24hrs), Avg:100.1 F (37.8 C), Min:100.1 F (37.8 C), Max:100.1 F (37.8 C)  Recent Labs  Lab 08/07/23 0645  WBC 8.9  CREATININE 0.67  LATICACIDVEN 0.9    Estimated Creatinine Clearance: 96.9 mL/min (by C-G formula based on SCr of 0.67 mg/dL).    Allergies  Allergen Reactions   Penicillins     Antimicrobials this admission: vancomycin 3/17 >>  cefepime 3/17 >>  Flagyl x 1 3/17  Dose adjustments this admission: N/A  Microbiology results: 3/17 BCx: pending  Thank you for allowing pharmacy to be a part of this patient's care.  Barrie Folk, PharmD 08/07/2023 9:26 AM

## 2023-08-07 NOTE — ED Provider Notes (Signed)
.-----------------------------------------   7:12 AM on 08/07/2023 -----------------------------------------  Blood pressure (!) 149/85, pulse (!) 106, temperature 100.1 F (37.8 C), resp. rate 20, height 5\' 7"  (1.702 m), weight 75.2 kg, last menstrual period 07/10/2023, SpO2 98%.  Assuming care from Dr. Rosalia Hammers.  In short, Emily Hutchinson is a 43 y.o. female with a chief complaint of Hand Infection .  Refer to the original H&P for additional details.  The current plan of care is to labs, admission for hand cellulitis.  7:57 AM: Consulted hospitalist who will evaluate the patient but he wanted a CT with IV contrast of the hand to make sure that no joint involvement.  CT order placed.             Claybon Jabs, MD 08/07/23 [phone]

## 2023-08-07 NOTE — Assessment & Plan Note (Signed)
 Progressive right upper extremity hand swelling as well as wrist and forearm tightness on evaluation today. Predominant concern for deep abscess versus?  Early compartment syndrome Case preliminarily discussed with Dr. Joice Lofts with orthopedic surgery CT of the head with contrast is pending to better assess No white count at present IV cefepime and vancomycin for infectious coverage Follow-up imaging Dr. Joice Lofts with orthopedic surgery to reevaluate Follow-up recommendations

## 2023-08-07 NOTE — Anesthesia Procedure Notes (Signed)
 Procedure Name: LMA Insertion Date/Time: 08/07/2023 3:15 PM  Performed by: Lenard Simmer, MDPre-anesthesia Checklist: Patient identified, Emergency Drugs available, Suction available, Patient being monitored and Timeout performed Patient Re-evaluated:Patient Re-evaluated prior to induction Oxygen Delivery Method: Circle system utilized Preoxygenation: Pre-oxygenation with 100% oxygen Induction Type: IV induction and Rapid sequence LMA: LMA inserted LMA Size: 4.0 Number of attempts: 1 Tube secured with: Tape

## 2023-08-07 NOTE — Op Note (Signed)
 08/07/2023  4:49 PM  Patient:   Emily Hutchinson  Pre-Op Diagnosis:   Septic right hand with probable flexor tenosynovitis of right ring finger.  Post-Op Diagnosis:   Same  Procedure:   1. Irrigation and debridement of right ring finger abscess.   2. Irrigation and debridement of suppurative flexor tenosynovitis right ring finger. 3. Irrigation and debridement of dorsal right hand.  Surgeon:   Maryagnes Amos, MD  Assistant:   None  Anesthesia:   General LMA  Findings:   As above.  Complications:   None  Fluids:   500 cc crystalloid  EBL:   25 cc  UOP:   None  TT:   54 minutes at 250 mmHg  Drains:   Penrose drains x 2  Closure:   4-0 Prolene interrupted sutures  Brief Clinical Note:   The patient is a 43 year old female with a history of anxiety/depression and IV drug abuse who presented to the emergency room with a 3-day history of progressively worsening pain, swelling, and erythema to her right hand.  Her examination was consistent with significant right hand cellulitis with a right ring finger abscess and possible flexor tenosynovitis of the right ring finger.  The patient presents at this time for formal irrigation and debridement of the right hand infection.  Procedure:   The patient was brought into the operating room and laid in the supine position.  After adequate general laryngeal mask anesthesia was obtained, the patient's right hand and upper extremity were prepped with ChloraPrep solution before being draped sterilely.  Perioperative antibiotics were continued.  The arm was held in an elevated position while a timeout was performed.  After several minutes, the tourniquet was inflated to 250 mmHg.    Attention initially was directed to the volar aspect of the right ring finger.  There was an area of blistering/questionable tissue over the volar ulnar aspect of the P1 pad of the ring finger.  This area was incised and debrided.  Purulent material was identified and  cultures obtained.  Tissue samples also were sent from this area for culture and sensitivity.  The radial and ulnar digital neurovascular structures were dissected free and identified.    The flexor sheath was carefully inspected beginning at the proximal end of the A1 pulley.  There was a small area of apparent violation of the flexor sheath, so was elected to proceed with a formal irrigation and debridement of the flexor sheath.  A 1 to 1.5 cm incision was made along the ulnar aspect of the ring finger at the level of the DIP flexion crease.  This was dissected bluntly to access the flexor sheath.  More proximally, the flexor sheath was entered at the proximal end of the A1 pulley.  Thickened tenosynovial tissue was debrided.  Clear slightly turbid fluid was identified.  Using a 14-gauge Angiocath, the flexor sheath was irrigated thoroughly with care taken to be sure that fluid exited the finger distally through the ulnar and incision.  The flexor sheath was flushed until clear.  Next, the dorsal aspect of the hand was approached.  An approximately 2 to 3 cm incision was made over the ring metacarpal.  This incision was carried down through the subcutaneous tissues.  Clear serous fluid was encountered but no obvious purulent material was noted.  Dissection was carried out across the dorsal aspect of the hand radially and ulnarly as well as more proximally toward the wrist.  Again, no purulent material was identified.  However,  cultures were obtained as a precaution.  All wounds were copiously irrigated with sterile saline solution before each of the volar ring and dorsal hand incisions were partially closed using 4-0 Prolene interrupted sutures.  The Penrose drains were placed in each wound to allow drainage of any remaining fluid.  A sterile bulky dressing was applied to the right hand before the patient was awakened, extubated, and returned to the recovery room in satisfactory condition after tolerating  the procedure well.

## 2023-08-07 NOTE — Consult Note (Signed)
 CODE SEPSIS - PHARMACY COMMUNICATION  **Broad Spectrum Antibiotics should be administered within 1 hour of Sepsis diagnosis**  Time Code Sepsis Called/Page Received: 0619  Antibiotics Ordered: cefepime, vancomycin and Flagyl  Time of 1st antibiotic administration: 0652  Additional action taken by pharmacy: N/A  Barrie Folk ,PharmD Clinical Pharmacist  08/07/2023  7:27 AM

## 2023-08-07 NOTE — Consult Note (Signed)
 ORTHOPAEDIC CONSULTATION  REQUESTING PHYSICIAN: Floydene Flock, MD  Chief Complaint:   Right hand pain, swelling, and erythema.  History of Present Illness: Emily Hutchinson is a 43 y.o. female with a history of IV drug abuse, as well as anxiety/depression who presented to the emergency room early this morning with a several day history of progressively worsening pain, swelling, and erythema to her right hand.  She notes that she did inject her right antecubital fossa recently, but does not injected into her hand directly.  She is not sure why she has so much pain and swelling over the ring finger region, denying any particular injury to this area.  In the emergency room, the patient was noted to have a white count of only 8.9 thousand but she was febrile with a temperature of 100.6.  A CT scan of the head demonstrated significant soft tissue swelling without obvious fluid loculations, but did demonstrate significant swelling along the ring finger concerning for a suppurative tenosynovitis.  The patient has been admitted for IV antibiotics and probable formal irrigation and debridement of the right hand and ring finger.  Past Medical History:  Diagnosis Date   Anxiety and depression    IV drug user    Past Surgical History:  Procedure Laterality Date   CESAREAN SECTION     CHOLECYSTECTOMY     Social History   Socioeconomic History   Marital status: Single    Spouse name: Not on file   Number of children: Not on file   Years of education: Not on file   Highest education level: Not on file  Occupational History   Not on file  Tobacco Use   Smoking status: Every Day    Current packs/day: 1.00    Types: Cigarettes   Smokeless tobacco: Never  Vaping Use   Vaping status: Every Day  Substance and Sexual Activity   Alcohol use: No   Drug use: Yes    Types: IV, Methamphetamines    Comment: fentanyl,   Sexual activity: Yes     Comment: methadone  Other Topics Concern   Not on file  Social History Narrative   Not on file   Social Drivers of Health   Financial Resource Strain: Not on file  Food Insecurity: Food Insecurity Present (08/07/2023)   Hunger Vital Sign    Worried About Running Out of Food in the Last Year: Often true    Ran Out of Food in the Last Year: Often true  Transportation Needs: Unmet Transportation Needs (08/07/2023)   PRAPARE - Administrator, Civil Service (Medical): Yes    Lack of Transportation (Non-Medical): Yes  Physical Activity: Not on file  Stress: Not on file  Social Connections: Socially Isolated (08/07/2023)   Social Connection and Isolation Panel [NHANES]    Frequency of Communication with Friends and Family: Three times a week    Frequency of Social Gatherings with Friends and Family: More than three times a week    Attends Religious Services: Never    Database administrator or Organizations: No    Attends Banker Meetings: Never    Marital Status: Never married   History reviewed. No pertinent family history. Allergies  Allergen Reactions   Penicillins    Prior to Admission medications   Medication Sig Start Date End Date Taking? Authorizing Provider  acetaminophen (TYLENOL) 325 MG tablet Take 2 tablets (650 mg total) by mouth every 6 (six) hours as needed for mild pain.  Patient not taking: Reported on 08/07/2023 03/12/18   Juliet Rude, PA-C  FLUoxetine (PROZAC) 20 MG capsule Take 1 capsule (20 mg total) by mouth at bedtime. Patient not taking: Reported on 03/10/2018 07/22/16   Shari Prows, MD  hydrOXYzine (ATARAX/VISTARIL) 25 MG tablet Take 1 tablet (25 mg total) by mouth 3 (three) times daily as needed for anxiety. Patient not taking: Reported on 03/10/2018 07/22/16   Pucilowska, Braulio Conte B, MD  meloxicam (MOBIC) 15 MG tablet Take 1 tablet (15 mg total) by mouth daily. Patient not taking: Reported on 06/10/2023 03/13/18   Juliet Rude, PA-C  methocarbamol (ROBAXIN) 500 MG tablet Take 1 tablet (500 mg total) by mouth every 8 (eight) hours as needed for muscle spasms. Patient not taking: Reported on 06/10/2023 03/12/18   Juliet Rude, PA-C  naloxone [facility]) nasal spray 4 mg/0.1 mL Use as directed Patient not taking: Reported on 08/07/2023 10/26/21   Georga Hacking, MD  oxyCODONE (OXY IR/ROXICODONE) 5 MG immediate release tablet Take 1 tablet (5 mg total) by mouth every 4 (four) hours as needed for severe pain. Patient not taking: Reported on 08/07/2023 03/12/18   Juliet Rude, PA-C  traZODone (DESYREL) 100 MG tablet Take 1 tablet (100 mg total) by mouth at bedtime. Patient not taking: Reported on 03/10/2018 07/22/16   Shari Prows, MD   CT HAND RIGHT W CONTRAST Result Date: 08/07/2023 CLINICAL DATA:  Hand swelling extending into the ring finger for 3 days. History of polysubstance abuse. EXAM: CT OF THE UPPER RIGHT EXTREMITY WITH CONTRAST TECHNIQUE: Multidetector CT imaging of the right hand was performed according to the standard protocol following intravenous contrast administration. RADIATION DOSE REDUCTION: This exam was performed according to the departmental dose-optimization program which includes automated exposure control, adjustment of the mA and/or kV according to patient size and/or use of iterative reconstruction technique. CONTRAST:  75mL OMNIPAQUE IOHEXOL 300 MG/ML  SOLN COMPARISON:  Radiographs 08/07/2023 FINDINGS: Bones/Joint/Cartilage No evidence of acute fracture, dislocation or osteomyelitis. There is an old fracture of the 5th metacarpal shaft. The joint spaces are preserved. No significant joint effusions are identified. Ligaments Suboptimally assessed by CT. Muscles and Tendons As evaluated by CT, the flexor and extensor tendons appear intact. Difficult to exclude mild tenosynovitis of the flexor tendon proximally in the ring finger. No focal muscular abnormalities are identified. Soft  tissues Extensive dorsal subcutaneous edema throughout the hand without focal fluid collection or suspicious associated enhancement. There is also prominent focal soft tissue swelling within the volar aspect of the proximal ring finger with mild surrounding heterogeneous soft tissue enhancement. No organized fluid collection, foreign body or soft tissue emphysema identified. As above, possible associated flexor tenosynovitis. IMPRESSION: 1. Prominent focal soft tissue swelling within the volar aspect of the proximal ring finger with mild surrounding heterogeneous soft tissue enhancement, suspicious for cellulitis. Possible associated flexor tenosynovitis. No organized fluid collection, foreign body or soft tissue emphysema identified. 2. Extensive dorsal subcutaneous edema throughout the hand without focal fluid collection or suspicious associated enhancement, likely cellulitis. 3. No evidence of osteomyelitis or septic arthritis. 4. Old fracture of the 5th metacarpal shaft. Electronically Signed   By: Carey Bullocks M.D.   On: 08/07/2023 09:45   DG Hand Complete Right Result Date: 08/07/2023 CLINICAL DATA:  Right hand swelling with concern for infection. EXAM: RIGHT HAND - COMPLETE 3+ VIEW COMPARISON:  None Available. FINDINGS: Soft tissue swelling in the hand and distal forearm with pronounced involvement of the ring  finger and dorsal hand. No gas, opaque foreign body, or bony erosion. Old fifth metacarpal shaft fracture which is healed. IMPRESSION: Pronounced soft tissue swelling without gas, opaque foreign body, or erosion. Electronically Signed   By: Tiburcio Pea M.D.   On: 08/07/2023 07:26   DG Chest Port 1 View Result Date: 08/07/2023 CLINICAL DATA:  Questionable sepsis EXAM: PORTABLE CHEST 1 VIEW COMPARISON:  03/10/2018 FINDINGS: Normal heart size and mediastinal contours. No acute infiltrate or edema. No effusion or pneumothorax. No acute osseous findings. IMPRESSION: No evidence of active  disease. Electronically Signed   By: Tiburcio Pea M.D.   On: 08/07/2023 07:25    Positive ROS: All other systems have been reviewed and were otherwise negative with the exception of those mentioned in the HPI and as above.  Physical Exam: General:  Alert, no acute distress Psychiatric:  Patient is competent for consent with normal mood and affect   Cardiovascular:  No pedal edema Respiratory:  No wheezing, non-labored breathing GI:  Abdomen is soft and non-tender Skin:  No lesions in the area of chief complaint Neurologic:  Sensation intact distally Lymphatic:  No axillary or cervical lymphadenopathy  Orthopedic Exam:  Orthopedic examination is limited to the right hand and forearm.  The patient demonstrates significant swelling and erythema involving the entire hand extending into the distal forearm but more focused over the proximal volar aspect of the ring finger where there is moderate ecchymosis as well.  The patient has fusiform swelling of the ring finger, as well as significant tenderness to palpation along the volar aspect of the finger.  She also has pain with any attempted active or passive motion of the finger.  She is able to tolerate gentle flexion and extension of the thumb, index, and long ring fingers without pain.  She has mild discomfort with flexion and extension of the little finger.  She is grossly neurovascularly intact all digits.  X-rays:  A recent CT scan of the right hand is available for review and has been reviewed by myself.  By report, the study demonstrates evidence of significant soft tissue swelling of the hand, especially in the area of the ring proximal phalanx, concerning for flexor tenosynovitis.  There is significant soft tissue swelling throughout the dorsum of the hand without evidence for focal fluid collections.  Both the films and report were reviewed by myself and discussed with the patient.  Assessment: Right hand cellulitis with probable right  ring suppurative flexor tenosynovitis.  Plan: The treatment options, including both surgical and nonsurgical choices, have been discussed in detail with the patient.  The patient would like to proceed with surgical intervention to include a formal irrigation debridement of the right hand as well as of the right ring finger flexor tendon sheath.  The risks (including bleeding, infection, nerve and/or blood vessel injury, persistent or recurrent pain, persistent or recurrent infection, stiffness of the fingers/hand, weakness of the finger/hand, need for further surgery, blood clots, strokes, heart attacks or arrhythmias, pneumonia, etc.) and benefits of the surgical procedure were discussed.  The patient states her understanding and agrees to proceed. A formal written consent will be obtained by the nursing staff.  Thank you for asking me to participate in the care of this most unfortunate woman.  I will follow her with you.   Maryagnes Amos, MD  Beeper #:  [phone]  08/07/2023 3:07 PM

## 2023-08-07 NOTE — H&P (Signed)
 History and Physical    Patient: Emily Hutchinson VHQ:[id number] DOB: [birth year] DOA: 08/07/2023 DOS: the patient was seen and examined on 08/07/2023 PCP: Pcp, No  Patient coming from: Home  Chief Complaint:  Chief Complaint  Patient presents with   Hand Infection   HPI: Emily Hutchinson is a 43 y.o. female with medical history significant of polysubstance abuse, IV drug use presenting with right extremity cellulitis.  Patient reports significant right upper extremity swelling and pain over the past 2 to 3 days.  Does report using IV drug use in the affected arm roughly 2 to 3 days prior.  Denies any injections directly into the hand.  Was predominately in the [facility] fossa.  Denies any other drug use.  Worsening pain and swelling.  No reported purulent drainage.  No fevers or chills.  No chest pain or shortness of breath.  Mild nausea.  No abdominal pain. Presented to ER Tmax 100.1, heart rate 100s, BP stable.  Satting well on room air.  White count 8.9, hemoglobin 11.4, platelets 184, creatinine 0.67.  AST 236, ALT 156, alk phos 206.  Right hand plain films with pronounced soft tissue swelling. Review of Systems: As mentioned in the history of present illness. All other systems reviewed and are negative. Past Medical History:  Diagnosis Date   Anxiety and depression    IV drug user    Past Surgical History:  Procedure Laterality Date   CESAREAN SECTION     CHOLECYSTECTOMY     Social History:  reports that she has been smoking cigarettes. She has never used smokeless tobacco. She reports current drug use. Drugs: IV and Methamphetamines. She reports that she does not drink alcohol.  Allergies  Allergen Reactions   Penicillins     History reviewed. No pertinent family history.  Prior to Admission medications   Medication Sig Start Date End Date Taking? Authorizing Provider  acetaminophen (TYLENOL) 325 MG tablet Take 2 tablets (650 mg total) by mouth every 6 (six) hours as needed for mild pain.  03/12/18   Juliet Rude, PA-C  FLUoxetine (PROZAC) 20 MG capsule Take 1 capsule (20 mg total) by mouth at bedtime. Patient not taking: Reported on 03/10/2018 07/22/16   Shari Prows, MD  hydrOXYzine (ATARAX/VISTARIL) 25 MG tablet Take 1 tablet (25 mg total) by mouth 3 (three) times daily as needed for anxiety. Patient not taking: Reported on 03/10/2018 07/22/16   Pucilowska, Braulio Conte B, MD  meloxicam (MOBIC) 15 MG tablet Take 1 tablet (15 mg total) by mouth daily. Patient not taking: Reported on 06/10/2023 03/13/18   Juliet Rude, PA-C  methocarbamol (ROBAXIN) 500 MG tablet Take 1 tablet (500 mg total) by mouth every 8 (eight) hours as needed for muscle spasms. Patient not taking: Reported on 06/10/2023 03/12/18   Juliet Rude, PA-C  naloxone [facility]) nasal spray 4 mg/0.1 mL Use as directed 10/26/21   Georga Hacking, MD  oxyCODONE (OXY IR/ROXICODONE) 5 MG immediate release tablet Take 1 tablet (5 mg total) by mouth every 4 (four) hours as needed for severe pain. 03/12/18   Juliet Rude, PA-C  traZODone (DESYREL) 100 MG tablet Take 1 tablet (100 mg total) by mouth at bedtime. Patient not taking: Reported on 03/10/2018 07/22/16   Shari Prows, MD    Physical Exam: Vitals:   08/07/23 0556 08/07/23 0730 08/07/23 0800 08/07/23 0830  BP:  (!) 115/94 125/80 126/67  Pulse:  87 86 90  Resp:  19 (!) 24 17  Temp:      SpO2:  100% 99% 98%  Weight: 75.2 kg     Height: 5\' 7"  (1.702 m)      Physical Exam Constitutional:      Appearance: She is normal weight.  HENT:     Head: Normocephalic and atraumatic.     Nose: Nose normal.  Eyes:     Pupils: Pupils are equal, round, and reactive to light.  Cardiovascular:     Rate and Rhythm: Normal rate and regular rhythm.  Pulmonary:     Effort: Pulmonary effort is normal.  Abdominal:     General: Bowel sounds are normal.  Musculoskeletal:     Comments: Decreased ROM and significant R hand swelling and redness  into the  forearm   Skin:    Comments: See picture          Data Reviewed:  There are no new results to review at this time.  DG Hand Complete Right CLINICAL DATA:  Right hand swelling with concern for infection.  EXAM: RIGHT HAND - COMPLETE 3+ VIEW  COMPARISON:  None Available.  FINDINGS: Soft tissue swelling in the hand and distal forearm with pronounced involvement of the ring finger and dorsal hand. No gas, opaque foreign body, or bony erosion. Old fifth metacarpal shaft fracture which is healed.  IMPRESSION: Pronounced soft tissue swelling without gas, opaque foreign body, or erosion.  Electronically Signed   By: Tiburcio Pea M.D.   On: 08/07/2023 07:26 DG Chest Port 1 View CLINICAL DATA:  Questionable sepsis  EXAM: PORTABLE CHEST 1 VIEW  COMPARISON:  03/10/2018  FINDINGS: Normal heart size and mediastinal contours. No acute infiltrate or edema. No effusion or pneumothorax. No acute osseous findings.  IMPRESSION: No evidence of active disease.  Electronically Signed   By: Tiburcio Pea M.D.   On: 08/07/2023 07:25  . Lab Results  Component Value Date   WBC 8.9 08/07/2023   HGB 11.4 (L) 08/07/2023   HCT 34.1 (L) 08/07/2023   MCV 88.1 08/07/2023   PLT 184 08/07/2023   Last metabolic panel Lab Results  Component Value Date   GLUCOSE 97 08/07/2023   NA 133 (L) 08/07/2023   K 3.2 (L) 08/07/2023   CL 100 08/07/2023   CO2 24 08/07/2023   BUN 9 08/07/2023   CREATININE 0.67 08/07/2023   GFRNONAA >60 08/07/2023   CALCIUM 8.4 (L) 08/07/2023   PROT 6.7 08/07/2023   ALBUMIN 3.3 (L) 08/07/2023   BILITOT 1.1 08/07/2023   ALKPHOS 206 (H) 08/07/2023   AST 236 (H) 08/07/2023   ALT 156 (H) 08/07/2023   ANIONGAP 9 08/07/2023    Assessment and Plan: * Cellulitis Progressive right upper extremity hand swelling as well as wrist and forearm tightness on evaluation today. Predominant concern for deep abscess versus?  Early compartment syndrome Case  preliminarily discussed with Dr. Joice Lofts with orthopedic surgery CT of the head with contrast is pending to better assess No white count at present IV cefepime and vancomycin for infectious coverage Follow-up imaging Dr. Joice Lofts with orthopedic surgery to reevaluate Follow-up recommendations  Transaminitis AST 236, ALT 156 on presentation  Suspect polysubstance abuse confounding issue  Will check hepatitis panel, tylenol level, ETOH level   Polysubstance abuse (HCC) Patient self admits to IV heroin use Suspect likely nidus for right upper extremity cellulitis versus early compartment syndrome Withdrawal protocol Check UDS Monitor      Advance Care Planning:   Code Status: Full Code   Consults: Orthopedic Surgery  Family Communication: No family at the bedside   Severity of Illness: The appropriate patient status for this patient is INPATIENT. Inpatient status is judged to be reasonable and necessary in order to provide the required intensity of service to ensure the patient's safety. The patient's presenting symptoms, physical exam findings, and initial radiographic and laboratory data in the context of their chronic comorbidities is felt to place them at high risk for further clinical deterioration. Furthermore, it is not anticipated that the patient will be medically stable for discharge from the hospital within 2 midnights of admission.   * I certify that at the point of admission it is my clinical judgment that the patient will require inpatient hospital care spanning beyond 2 midnights from the point of admission due to high intensity of service, high risk for further deterioration and high frequency of surveillance required.*  Author: Floydene Flock, MD 08/07/2023 9:26 AM  For on call review [email or web address].

## 2023-08-07 NOTE — Assessment & Plan Note (Signed)
 Patient self admits to IV heroin use Suspect likely nidus for right upper extremity cellulitis versus early compartment syndrome Withdrawal protocol Check UDS Monitor

## 2023-08-07 NOTE — ED Triage Notes (Signed)
 Pt arrived POV for right hand swelling, first noticed 3 days ago to her right ring finger and her right hand has steadily gotten worse. Pt's right hand is severely swollen. Pt having a hard time flexing her fingers of the right hand d/t swelling and tautness Redness noted. + radial pulse and CNS intact at this time. Pt is IV drug user, last used was 2 days ago, reports has not "shot up" in her right hand. Pt has low grade fever.   Tetanus unknown

## 2023-08-07 NOTE — ED Provider Notes (Signed)
 [facility] Provider Note    Event Date/Time   First MD Initiated Contact with Patient 08/07/23 0600     (approximate)   History   Hand Infection   HPI  Emily Hutchinson is a 43 year old female with history of polysubstance use presenting to the emergency department for evaluation of hand swelling.  3 days ago she noticed some swelling around her right ring finger.  Since that time she has had progressive swelling of her hand to the point where it is painful to flex them.  Does have some redness of the area.  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  Was previously wearing the ring on the right ring finger, but denies trauma of her hand.  Reports objective fevers at home.  Does additionally report some pain in her right armpit.  Denies rash of other areas of her body.     Physical Exam   Triage Vital Signs: ED Triage Vitals  Encounter Vitals Group     BP 08/07/23 0553 (!) 149/85     Systolic BP Percentile --      Diastolic BP Percentile --      Pulse Rate 08/07/23 0553 (!) 106     Resp 08/07/23 0553 20     Temp 08/07/23 0553 100.1 F (37.8 C)     Temp src --      SpO2 08/07/23 0553 98 %     Weight 08/07/23 0556 165 lb 12.6 oz (75.2 kg)     Height 08/07/23 0556 5\' 7"  (1.702 m)     Head Circumference --      Peak Flow --      Pain Score 08/07/23 0555 10     Pain Loc --      Pain Education --      Exclude from Growth Chart --     Most recent vital signs: Vitals:   08/07/23 0553  BP: (!) 149/85  Pulse: (!) 106  Resp: 20  Temp: 100.1 F (37.8 C)  SpO2: 98%     General: Awake, interactive  CV:  Tachycardic with regular rhythm, good peripheral perfusion Resp:  Unlabored respirations, lungs clear to auscultation Abd:  Nondistended, soft, nontender to palpation Neuro:  Symmetric facial movement, fluid speech MSK:  Extensive swelling of the right hand with area of erythema most notably over the right ring finger.  There is some mild  swelling extending into the forearm, but the compartment is not tense.  There is faint linear erythema tracking up the arm.  There is some tenderness of the right axilla likely due to lymphadenopathy.  See images below.          ED Results / Procedures / Treatments   Labs (all labs ordered are listed, but only abnormal results are displayed) Labs Reviewed  CBC WITH DIFFERENTIAL/PLATELET - Abnormal; Notable for the following components:      Result Value   Hemoglobin 11.4 (*)    HCT 34.1 (*)    All other components within normal limits  CULTURE, BLOOD (ROUTINE X 2)  CULTURE, BLOOD (ROUTINE X 2)  LACTIC ACID, PLASMA  LACTIC ACID, PLASMA  COMPREHENSIVE METABOLIC PANEL  PREGNANCY, URINE  POC URINE PREG, ED     EKG EKG independently reviewed interpreted by myself (ER attending) demonstrates:    RADIOLOGY Imaging independently reviewed and interpreted by myself demonstrates:  Chest x-Keva Darty pending Hand x-Orra Nolde pending  PROCEDURES:  Critical Care performed: Yes, see critical care procedure note(s)  CRITICAL CARE Performed by: Trinna Post   Total critical care time: 31 minutes  Critical care time was exclusive of separately billable procedures and treating other patients.  Critical care was necessary to treat or prevent imminent or life-threatening deterioration.  Critical care was time spent personally by me on the following activities: development of treatment plan with patient and/or surrogate as well as nursing, discussions with consultants, evaluation of patient's response to treatment, examination of patient, obtaining history from patient or surrogate, ordering and performing treatments and interventions, ordering and review of laboratory studies, ordering and review of radiographic studies, pulse oximetry and re-evaluation of patient's condition.   Procedures   MEDICATIONS ORDERED IN ED: Medications  sodium chloride 0.9 % bolus 1,000 mL (1,000 mLs Intravenous New  Bag/Given 08/07/23 0653)  ceFEPIme (MAXIPIME) 2 g in sodium chloride 0.9 % 100 mL IVPB (2 g Intravenous New Bag/Given 08/07/23 1610)  metroNIDAZOLE (FLAGYL) IVPB 500 mg (500 mg Intravenous New Bag/Given 08/07/23 0707)  vancomycin (VANCOCIN) IVPB 1000 mg/200 mL premix (has no administration in time range)  fentaNYL (SUBLIMAZE) injection 50 mcg (50 mcg Intravenous Given 08/07/23 0653)     IMPRESSION / MDM / ASSESSMENT AND PLAN / ED COURSE  I reviewed the triage vital signs and the nursing notes.  Differential diagnosis includes, but is not limited to, cellulitis with associated swelling of the hand, developing lymphangitic streaking, no clear abscess on exam  Patient's presentation is most consistent with acute presentation with potential threat to life or bodily function.  43 year old female presenting with prominent right hand swelling, tracking erythema, tachycardia, borderline fever.  Sepsis orders initiated with empiric broad-spectrum antibiotics.  Given degree of swelling and progression, do think patient is appropriate for admission with IV antibiotics.  Labs, imaging pending.  Signed out to oncoming physician at 0700 pending completion of these and anticipated admission.      FINAL CLINICAL IMPRESSION(S) / ED DIAGNOSES   Final diagnoses:  Cellulitis of right upper extremity  Hand swelling     Rx / DC Orders   ED Discharge Orders     None        Note:  This document was prepared using Dragon voice recognition software and may include unintentional dictation errors.   Trinna Post, MD 08/07/23 [phone]

## 2023-08-07 NOTE — Assessment & Plan Note (Signed)
 AST 236, ALT 156 on presentation  Suspect polysubstance abuse confounding issue  Will check hepatitis panel, tylenol level, ETOH level

## 2023-08-07 NOTE — Transfer of Care (Signed)
 Immediate Anesthesia Transfer of Care Note  Patient: Emily Hutchinson  Procedure(s) Performed: IRRIGATION AND DEBRIDEMENT WOUND (Right)  Patient Location: PACU  Anesthesia Type:General  Level of Consciousness: drowsy  Airway & Oxygen Therapy: Patient Spontanous Breathing and Patient connected to face mask oxygen  Post-op Assessment: Report given to RN  Post vital signs: stable  Last Vitals:  Vitals Value Taken Time  BP 97/41 08/07/23 1640  Temp    Pulse 67 08/07/23 1642  Resp 20 08/07/23 1642  SpO2 100 % 08/07/23 1642  Vitals shown include unfiled device data.  Last Pain:  Vitals:   08/07/23 1327  TempSrc: Temporal  PainSc: 8          Complications: There were no known notable events for this encounter.

## 2023-08-07 NOTE — ED Notes (Signed)
 Dr. Rosalia Hammers notified of possible hand infection with possible sepsis  Pt did not meet criteria to activate sepsis protocol in triage base on questionnaire although she has slight temp, HR>90, and risk of infection. Not AMS, RR not >20, temp not >100.9

## 2023-08-07 NOTE — Sepsis Progress Note (Signed)
 Elink following code sepsis

## 2023-08-07 NOTE — Anesthesia Preprocedure Evaluation (Addendum)
 Anesthesia Evaluation  Patient identified by MRN, date of birth, ID band Patient awake    Reviewed: Allergy & Precautions, NPO status , Patient's Chart, lab work & pertinent test results  History of Anesthesia Complications Negative for: history of anesthetic complications  Airway Mallampati: II  TM Distance: >3 FB Neck ROM: full    Dental  (+) Lower Dentures, Upper Dentures   Pulmonary neg shortness of breath, Current Smoker   Pulmonary exam normal        Cardiovascular Exercise Tolerance: Good (-) angina (-) Past MI negative cardio ROS Normal cardiovascular exam     Neuro/Psych  PSYCHIATRIC DISORDERS      negative neurological ROS     GI/Hepatic negative GI ROS,neg GERD  ,,(+)     substance abuse    Endo/Other  negative endocrine ROS    Renal/GU      Musculoskeletal   Abdominal   Peds  Hematology negative hematology ROS (+)   Anesthesia Other Findings Past Medical History: No date: Anxiety and depression No date: IV drug user  Past Surgical History: No date: CESAREAN SECTION No date: CHOLECYSTECTOMY  BMI    Body Mass Index: 25.97 kg/m      Reproductive/Obstetrics negative OB ROS                             Anesthesia Physical Anesthesia Plan  ASA: 2  Anesthesia Plan: General LMA   Post-op Pain Management:    Induction: Intravenous  PONV Risk Score and Plan: Dexamethasone, Ondansetron, Midazolam and Treatment may vary due to age or medical condition  Airway Management Planned: LMA  Additional Equipment:   Intra-op Plan:   Post-operative Plan: Extubation in OR  Informed Consent: I have reviewed the patients History and Physical, chart, labs and discussed the procedure including the risks, benefits and alternatives for the proposed anesthesia with the patient or authorized representative who has indicated his/her understanding and acceptance.     Dental  Advisory Given  Plan Discussed with: Anesthesiologist, CRNA and Surgeon  Anesthesia Plan Comments: (Patient consented for risks of anesthesia including but not limited to:  - adverse reactions to medications - damage to eyes, teeth, lips or other oral mucosa - nerve damage due to positioning  - sore throat or hoarseness - Damage to heart, brain, nerves, lungs, other parts of body or loss of life  Patient voiced understanding and assent.)       Anesthesia Quick Evaluation

## 2023-08-08 ENCOUNTER — Encounter: Payer: Self-pay | Admitting: Surgery

## 2023-08-08 LAB — CBC
HCT: 33.5 % — ABNORMAL LOW (ref 36.0–46.0)
Hemoglobin: 11.1 g/dL — ABNORMAL LOW (ref 12.0–15.0)
MCH: 28.9 pg (ref 26.0–34.0)
MCHC: 33.1 g/dL (ref 30.0–36.0)
MCV: 87.2 fL (ref 80.0–100.0)
Platelets: 179 10*3/uL (ref 150–400)
RBC: 3.84 MIL/uL — ABNORMAL LOW (ref 3.87–5.11)
RDW: 14.6 % (ref 11.5–15.5)
WBC: 7 10*3/uL (ref 4.0–10.5)
nRBC: 0 % (ref 0.0–0.2)

## 2023-08-08 LAB — COMPREHENSIVE METABOLIC PANEL
ALT: 98 U/L — ABNORMAL HIGH (ref 0–44)
AST: 64 U/L — ABNORMAL HIGH (ref 15–41)
Albumin: 2.8 g/dL — ABNORMAL LOW (ref 3.5–5.0)
Alkaline Phosphatase: 162 U/L — ABNORMAL HIGH (ref 38–126)
Anion gap: 6 (ref 5–15)
BUN: 10 mg/dL (ref 6–20)
CO2: 23 mmol/L (ref 22–32)
Calcium: 8.5 mg/dL — ABNORMAL LOW (ref 8.9–10.3)
Chloride: 108 mmol/L (ref 98–111)
Creatinine, Ser: 0.51 mg/dL (ref 0.44–1.00)
GFR, Estimated: >60 mL/min (ref >60)
Glucose, Bld: 118 mg/dL — ABNORMAL HIGH (ref 70–99)
Potassium: 3.9 mmol/L (ref 3.5–5.1)
Sodium: 137 mmol/L (ref 135–145)
Total Bilirubin: 1 mg/dL (ref 0.0–1.2)
Total Protein: 6.2 g/dL — ABNORMAL LOW (ref 6.5–8.1)

## 2023-08-08 MED ORDER — FENTANYL CITRATE PF 50 MCG/ML IJ SOSY
25.0000 ug | PREFILLED_SYRINGE | Freq: Two times a day (BID) | INTRAMUSCULAR | Status: DC | PRN
  Administered 2023-08-08 – 2023-08-09 (×2): 25 ug via INTRAVENOUS
  Filled 2023-08-08 (×2): qty 1

## 2023-08-08 MED ORDER — OXYCODONE HCL 5 MG PO TABS
5.0000 mg | ORAL_TABLET | ORAL | Status: DC | PRN
  Administered 2023-08-08 – 2023-08-11 (×10): 10 mg via ORAL
  Administered 2023-08-12: 5 mg via ORAL
  Administered 2023-08-12 – 2023-08-14 (×5): 10 mg via ORAL
  Filled 2023-08-08 (×16): qty 2

## 2023-08-08 MED ORDER — NICOTINE 21 MG/24HR TD PT24
21.0000 mg | MEDICATED_PATCH | Freq: Every day | TRANSDERMAL | Status: DC
Start: 2023-08-08 — End: 2023-08-14
  Administered 2023-08-08 – 2023-08-14 (×7): 21 mg via TRANSDERMAL
  Filled 2023-08-08 (×7): qty 1

## 2023-08-08 MED ORDER — SODIUM CHLORIDE 0.9 % IV SOLN
2.0000 g | INTRAVENOUS | Status: DC
  Administered 2023-08-08: 2 g via INTRAVENOUS
  Filled 2023-08-08 (×2): qty 20

## 2023-08-08 NOTE — Evaluation (Signed)
 Occupational Therapy Evaluation Patient Details Name: Emily Hutchinson MRN: [id number] DOB: 1981-03-30 Today's Date: 08/08/2023   History of Present Illness   Pt is a 43 y.o. female presenting with right extremity cellulitis and Transaminitis. Pt is now s/p I&D of right ring finger abscess/suppurative flexor tenosynovitis and dorsal right hand. NWB and to be elevated above heart. PMH of polysubstance abuse, IV drug use     Clinical Impressions Pt was seen for OT evaluation this date. PTA, pt reports living with a friend and being fully IND with all tasks. She has no current functional decline or weakness. Edu on RUE precautions, e.g. NWB and positioning to decrease swelling and ROM to reduce swelling and weakness. Pt verbalized understanding.  IND to perform LB dressing using L hand only and IND with bed mobility, STS and ~30 feet of mobility with no AD prior to returning to bed. Pt very anxious and upset that she does not have a nicotine patch or pain meds, states "I'm an addict, I can't be in the hospital I need my vape and drugs, and dope and I'm so hot!" Provided pt a fan and notified nurse of need for pain meds among other questions pt has. No further acute OT needs and no OT needed on DC. Will sign off in house.     If plan is discharge home, recommend the following:         Functional Status Assessment   Patient has not had a recent decline in their functional status     Equipment Recommendations         Recommendations for Other Services         Precautions/Restrictions   Precautions Precautions: None Restrictions Weight Bearing Restrictions Per Provider Order: Yes RUE Weight Bearing Per Provider Order: Non weight bearing     Mobility Bed Mobility Overal bed mobility: Independent                  Transfers Overall transfer level: Independent                 General transfer comment: IND with STS and mobility ~30 feet      Balance Overall  balance assessment: Independent                                         ADL either performed or assessed with clinical judgement   ADL Overall ADL's : Independent                                       General ADL Comments: able to don/doff socks with IND using L hand     Vision         Perception         Praxis         Pertinent Vitals/Pain Pain Assessment Pain Assessment: Faces Faces Pain Scale: Hurts little more Pain Location: R hand Pain Intervention(s): Monitored during session, Patient requesting pain meds-RN notified     Extremity/Trunk Assessment Upper Extremity Assessment Upper Extremity Assessment: RUE deficits/detail RUE Deficits / Details: R hand dressing/NWB   Lower Extremity Assessment Lower Extremity Assessment: Overall WFL for tasks assessed       Communication     Cognition Arousal: Alert Behavior During Therapy: WFL for tasks assessed/performed, Anxious, Impulsive Cognition: No  apparent impairments             OT - Cognition Comments: very anxious and impulsive stating she needs her vape and she's hot                 Following commands: Intact       Cueing  General Comments   Cueing Techniques: Verbal cues  R hand dressing intact pre/post session; pt very agitated regarding being an addict and needing her vape, drugs, etc., stating she feels terrible and is hot   Exercises Other Exercises Other Exercises: Edu on role of OT in acute setting.   Shoulder Instructions      Home Living Family/patient expects to be discharged to:: Private residence Living Arrangements: Non-relatives/Friends                                      Prior Functioning/Environment Prior Level of Function : Independent/Modified Independent;Driving             Mobility Comments: IND, no AD use ADLs Comments: IND    OT Problem List: Pain   OT Treatment/Interventions:        OT  Goals(Current goals can be found in the care plan section)       OT Frequency:       Co-evaluation              AM-PAC OT "6 Clicks" Daily Activity     Outcome Measure Help from another person eating meals?: None Help from another person taking care of personal grooming?: None Help from another person toileting, which includes using toliet, bedpan, or urinal?: None Help from another person bathing (including washing, rinsing, drying)?: None Help from another person to put on and taking off regular upper body clothing?: None Help from another person to put on and taking off regular lower body clothing?: None 6 Click Score: 24   End of Session Nurse Communication: Mobility status  Activity Tolerance: Patient tolerated treatment well Patient left: in bed;with call bell/phone within reach  OT Visit Diagnosis: Other abnormalities of gait and mobility (R26.89)                Time: 4098-1191 OT Time Calculation (min): 23 min Charges:  OT General Charges $OT Visit: 1 Visit OT Evaluation $OT Eval Low Complexity: 1 Low OT Treatments $Self Care/Home Management : 8-22 mins Raiden Yearwood, OTR/L  08/08/23, 11:44 AM  Marlos Carmen E Johnella Crumm 08/08/2023, 11:40 AM

## 2023-08-08 NOTE — Progress Notes (Signed)
  Subjective: 1 Day Post-Op Procedure(s) (LRB): IRRIGATION AND DEBRIDEMENT WOUND (Right) Patient reports pain as 7 on 0-10 scale.  Patient was asleep when I entered the room. Patient is well, and has had no acute complaints or problems Plan is to go Home after hospital stay. Negative for chest pain and shortness of breath Fever: no Gastrointestinal:Negative for nausea and vomiting  Objective: Vital signs in last 24 hours: Temp:  [97.6 F (36.4 C)-100.6 F (38.1 C)] 97.6 F (36.4 C) (03/18 0740) Pulse Rate:  [62-90] 84 (03/18 0740) Resp:  [16-20] 16 (03/18 0740) BP: (90-126)/(45-67) 97/59 (03/18 0740) SpO2:  [95 %-100 %] 100 % (03/18 0740) Weight:  [75.2 kg] 75.2 kg (03/17 1327)  Intake/Output from previous day:  Intake/Output Summary (Last 24 hours) at 08/08/2023 0824 Last data filed at 08/07/2023 2335 Gross per 24 hour  Intake 2744.17 ml  Output 275 ml  Net 2469.17 ml    Intake/Output this shift: No intake/output data recorded.  Labs: Recent Labs    08/07/23 0645 08/08/23 0426  HGB 11.4* 11.1*   Recent Labs    08/07/23 0645 08/08/23 0426  WBC 8.9 7.0  RBC 3.87 3.84*  HCT 34.1* 33.5*  PLT 184 179   Recent Labs    08/07/23 0645 08/08/23 0426  NA 133* 137  K 3.2* 3.9  CL 100 108  CO2 24 23  BUN 9 10  CREATININE 0.67 0.51  GLUCOSE 97 118*  CALCIUM 8.4* 8.5*   No results for input(s): "LABPT", "INR" in the last 72 hours.   EXAM General - Patient is Alert, Appropriate, and Oriented Extremity - Bulky dressing intact to the right hand. Dressing removed, still moderate swelling to the hand and ring finger. Erythema improved per the patient. No purulent material could be expressed. Drains were advanced but not removed. New bulky dressing applied. Able to gently flex and extend fingers, intact cap refill.   Past Medical History:  Diagnosis Date   Anxiety and depression    IV drug user     Assessment/Plan: 1 Day Post-Op Procedure(s)  (LRB): IRRIGATION AND DEBRIDEMENT WOUND (Right) Principal Problem:   Cellulitis Active Problems:   Polysubstance abuse (HCC)   Transaminitis  Estimated body mass index is 25.97 kg/m as calculated from the following:   Height as of this encounter: 5\' 7"  (1.702 m).   Weight as of this encounter: 75.2 kg. Advance diet Up with therapy D/C IV fluids  Labs and vitals reviewed this.  WBC 7.0 this AM. Dressing change performed, drains were advanced. Cultures from surgery are pending at this time.  Continue IV Vancomycin and Cefepime. Will potentially remove drains tomorrow pending condition. Continue to keep right hand elevated above heart level.   DVT Prophylaxis - Lovenox Non-weightbearing to the right hand.  Valeria Batman, PA-C [facility] Orthopaedic Surgery 08/08/2023, 8:24 AM

## 2023-08-08 NOTE — Progress Notes (Signed)
  PROGRESS NOTE    Emily Hutchinson  WUJ:[id number] DOB: 1980/06/20 DOA: 08/07/2023 PCP: Pcp, No  205A/205A-AA  LOS: 1 day   Brief hospital course:   Assessment & Plan: Emily Hutchinson is a 43 y.o. female with medical history significant of polysubstance abuse, IV drug use presenting with right extremity cellulitis.  Patient reports significant right upper extremity swelling and pain over the past 2 to 3 days.  Does report using IV drug use in the affected arm roughly 2 to 3 days prior.  Denies any injections directly into the hand.     * Cellulitis of right hand --s/p OR I/D on 3/17 with cx obtained --cont vanc and ceftriaxone  Transaminitis AST 236, ALT 156 on presentation  --hepatitis panel pos for HCV  Polysubstance abuse (HCC) Patient self admits to IV heroin use --current UDS pos for amphetamine.  Prior UDS was pos for cocaine.  Current smoker --nicotine patch   DVT prophylaxis: Lovenox SQ Code Status: Full code  Family Communication:  Level of care: Med-Surg Dispo:   The patient is from: home Anticipated d/c is to: home Anticipated d/c date is: undetermined   Subjective and Interval History:  Pt complained of a lot of pain, was crying, and wanted to go home to her bed, and to smoke.   Objective: Vitals:   08/07/23 2224 08/08/23 0420 08/08/23 0740 08/08/23 1505  BP: 104/61 101/62 (!) 97/59 114/60  Pulse: 62 77 84 92  Resp: 16 16 16 16   Temp: 98 F (36.7 C) 98.3 F (36.8 C) 97.6 F (36.4 C) 98.9 F (37.2 C)  TempSrc: Oral Oral Oral Oral  SpO2: 98% 97% 100% 100%  Weight:      Height:        Intake/Output Summary (Last 24 hours) at 08/08/2023 1949 Last data filed at 08/08/2023 1500 Gross per 24 hour  Intake 320 ml  Output --  Net 320 ml   Filed Weights   08/07/23 0556 08/07/23 1327  Weight: 75.2 kg 75.2 kg    Examination:   Constitutional: NAD, AAOx3 HEENT: conjunctivae and lids normal, EOMI CV: No cyanosis.   RESP: normal respiratory effort, on  RA Extremities: right hand in surgical wrap Neuro: II - XII grossly intact.   Psych: labile mood and affect.     Data Reviewed: I have personally reviewed labs and imaging studies  Time spent: 50 minutes  Darlin Priestly, MD [facility] If 7PM-7AM, please contact night-coverage 08/08/2023, 7:49 PM

## 2023-08-09 LAB — CBC
HCT: 34.3 % — ABNORMAL LOW (ref 36.0–46.0)
Hemoglobin: 11.4 g/dL — ABNORMAL LOW (ref 12.0–15.0)
MCH: 29.5 pg (ref 26.0–34.0)
MCHC: 33.2 g/dL (ref 30.0–36.0)
MCV: 88.6 fL (ref 80.0–100.0)
Platelets: 199 10*3/uL (ref 150–400)
RBC: 3.87 MIL/uL (ref 3.87–5.11)
RDW: 14.8 % (ref 11.5–15.5)
WBC: 4.5 10*3/uL (ref 4.0–10.5)
nRBC: 0 % (ref 0.0–0.2)

## 2023-08-09 LAB — BASIC METABOLIC PANEL
Anion gap: 3 — ABNORMAL LOW (ref 5–15)
BUN: 15 mg/dL (ref 6–20)
CO2: 24 mmol/L (ref 22–32)
Calcium: 7.9 mg/dL — ABNORMAL LOW (ref 8.9–10.3)
Chloride: 112 mmol/L — ABNORMAL HIGH (ref 98–111)
Creatinine, Ser: 0.56 mg/dL (ref 0.44–1.00)
GFR, Estimated: >60 mL/min (ref >60)
Glucose, Bld: 91 mg/dL (ref 70–99)
Potassium: 3.8 mmol/L (ref 3.5–5.1)
Sodium: 139 mmol/L (ref 135–145)

## 2023-08-09 LAB — MAGNESIUM: Magnesium: 2 mg/dL (ref 1.7–2.4)

## 2023-08-09 LAB — GLUCOSE, CAPILLARY: Glucose-Capillary: 142 mg/dL — ABNORMAL HIGH (ref 70–99)

## 2023-08-09 MED ORDER — METHADONE HCL 10 MG PO TABS
10.0000 mg | ORAL_TABLET | Freq: Every day | ORAL | Status: DC
  Administered 2023-08-09: 10 mg via ORAL
  Filled 2023-08-09: qty 1

## 2023-08-09 MED ORDER — LORAZEPAM 2 MG/ML IJ SOLN: 1.0000 mg | Freq: Once | INTRAMUSCULAR | Status: DC | PRN

## 2023-08-09 NOTE — Progress Notes (Signed)
  PROGRESS NOTE    Emily Hutchinson  JYN:[id number] DOB: Jul 25, 1980 DOA: 08/07/2023 PCP: Pcp, No  205A/205A-AA  LOS: 2 days   Brief hospital course:   Assessment & Plan: Emily Hutchinson is a 43 y.o. female with medical history significant of polysubstance abuse, IV drug use presenting with right extremity cellulitis.  Patient reports significant right upper extremity swelling and pain over the past 2 to 3 days.  Does report using IV drug use in the affected arm roughly 2 to 3 days prior.  Denies any injections directly into the hand.     * Cellulitis of right hand --s/p OR I/D on 3/17 with cx obtained which is pos for MRSA --cont Vanc --d/c ceftriaxone  Transaminitis AST 236, ALT 156 on presentation  --hepatitis panel pos for HCV  Polysubstance abuse (HCC) Patient self admits to IV heroin use --current UDS pos for amphetamine.  Prior UDS was pos for cocaine.  Opioids withdrawal --start methadone 10 mg daily --if symptoms not controlled, can increase to 10 mg BID  Current smoker --nicotine patch   DVT prophylaxis: Lovenox SQ Code Status: Full code  Family Communication:  Level of care: Med-Surg Dispo:   The patient is from: home Anticipated d/c is to: home Anticipated d/c date is: undetermined   Subjective and Interval History:  RN reported pt agitated this morning, complained of withdrawal symptoms.  Methadone started.   Objective: Vitals:   08/09/23 0425 08/09/23 0831 08/09/23 1558 08/09/23 1937  BP: (!) 103/59 (!) 142/73 116/85 139/72  Pulse: 74 73 98 92  Resp: 16 18 15 20   Temp: 98.3 F (36.8 C) 98.7 F (37.1 C) 99.5 F (37.5 C) 98.1 F (36.7 C)  TempSrc: Oral Oral Oral Oral  SpO2: 100% 100% 99% 100%  Weight:      Height:        Intake/Output Summary (Last 24 hours) at 08/09/2023 2019 Last data filed at 08/09/2023 1900 Gross per 24 hour  Intake 1700 ml  Output --  Net 1700 ml   Filed Weights   08/07/23 0556 08/07/23 1327  Weight: 75.2 kg 75.2 kg     Examination:   Constitutional: NAD, AAOx3 HEENT: conjunctivae and lids normal, EOMI CV: No cyanosis.   RESP: normal respiratory effort, on RA Neuro: II - XII grossly intact.     Data Reviewed: I have personally reviewed labs and imaging studies  Time spent: 50 minutes  Darlin Priestly, MD [facility] If 7PM-7AM, please contact night-coverage 08/09/2023, 8:19 PM

## 2023-08-09 NOTE — Plan of Care (Signed)

## 2023-08-09 NOTE — Discharge Instructions (Signed)
 You are encouraged to call the [facility] and arrange an application appointment to be screened for service to get set up with a physician for primary care  You may print the application and take with you to the appointment. At this web address [email or web address] Please Call [facility] for appointment  [phone]  [address] [place], [place] [address]  Office Hours Monday: Closed Tuesday: 9:00am - 4:00pm Wednesday: 9:00am - 4:00pm Thursday: 9:00am - 8:00pm Friday: Closed  Endocrinology 2nd Thursday of the Month: 5:00pm - 8:00pm  Rheumatology/Orthopedic Clinic By appointment only.  Contact for availability.     Services Not Covered Obstetrics Gastrointestinal/Liver Disease   Intensive Outpatient Programs   [facility] [address] [place],  [place], [place] [phone]  [facility] (Inpatient and outpatient)[phone] (Suboxone and Methadone) [address] [phone]  ADS: [facility] Programs - Intensive Outpatient [address] [place], [place] [address], [place]  [phone]  [facility] (Outpatient, Inpatient, [facility] (Groups and Residental) (insurance only) [phone] [place], [place] [phone]   [facility] (for caregivers and family) [address] Laurell Josephs [address] [place], [place] [phone]  Residential Treatment Programs  [facility](2 years) Residential: 57 days)[facility] ([facility] [address] [place], [place], [place] [phone] or [phone]  D.R.E.A.M.S Treatment  [facility] [address] [place], [place], [place] [phone]  [facility] Services (RTS) [address] [place], [place], [place] [phone] Admissions: 8am-3pm M-F  [facility] Program: Residential Program [phone] Days)             ADATC: [facility]  [place], [place], [place]  [phone] or [phone] in Hours over the weekend or by referral)  [facility] [address] [place], [place] [address] [phone] (Do virtual or phone assessment, offer transportation within 25 miles, have in patient and Outpatient options)   Mobil Crisis: The[facility] Alternatives:[phone] (for crisis response 24 hours a day)    the [facility] on [facility] offers a [employer] that anyone can call toll free at [phone]. The friendship line is available 24 hours a day  [facility] is a Program of All-inclusive Care for the Elderly (PACE). Their mission is to promote and sustain the independence of seniors wishing to remain in the community. They provide seniors with comprehensive long-term health, social, medical and dietary care. Their program is a safe alternative to nursing home care. [phone]  [facility] Physical Address [facility] [address] [place], [place] [address] Phone: [phone]. . Online zoom yoga class, connect with others without leaving your home [facility] offers [place] dance cardio sessions for individuals via Zoom. This program provides: - Dance fitness activities Please contact program for more information. Servinganyone in need adults 18+ hiv/aids individuals families Call [phone]  Email [email or web address] to get more info  [employer] offers an online [employer] to individuals where they can receive help to focus on  their best health. Whether you're a [employer] member or not, the neighborhood center offers a... Main Serviceshealth education  exercise & fitness  community support services  recreation  virtual support Other Servicessupport groups Servinganyone in need adults young adults teens seniors individuals families [email or web address] to get  more info  Schedule on their website  The [facility] offers an array of activities for adults age 60 and over. This program provides:- Fitness and health programs- Tech classes- Activity books Main Serviceshealth education  community support services  exercise & fitness  recreation  more education Servingseniors  Call [phone]    For more resources go online to [email or web address].co.uk and type in you zipcode  Agency Name: [facility] Agency Address: [address] [place], [place] [address] Phone: [phone] Website: www.[email or web address].org Service(s) Offered: Housing services, self-sufficiency, congregate meal program, and individual development account program.  Agency Name: [employer] [facility] [place] Address: [address], [place], [place] [address] Phone: [phone] Email: [email or web address] Website: www.[email or web address].org Service(s) Offered: Housing the homeless, feeding the hungry, Company secretary, job and education related services.  Agency Name: [facility] Address: [address] [place], [place] [address] Phone: [phone] Email: [email or web address] Service(s) Offered: Counseling, problem pregnancy, advocacy for Hispanics, limited emergency financial assistance.  Agency Name: Department of Social Services Address: [address] [place], [place] [address] Phone: [phone] Website: [email or web address] Service(s) Offered: Child support services; child welfare services; SNAP; Medicaid; work first family assistance; and aid with fuel,   rent, food and medicine.  Agency Name: Holiday representative Address: [address], [place], [place] [address] Phone: [phone] or [phone] Email: [email or web address] Service(s) Offered: Family services and transient assistance; emergency food, fuel, clothing, limited furniture, utilities; budget counseling, general counseling; give a kid a coat; thrift store; Christmas food and toys. Utility assistance, food pantry, rental  assistance, life sustaining medicine  Transportation Resources  Agency Name: [facility] Agency Address: [address] [place], [place] [address] Phone: [phone] Email: [email or web address] Website: www.[email or web address] Service(s) Offered: Housing services, self-sufficiency, congregate meal program, weatherization program, Field seismologist program, emergency food assistance,  housing counseling, home ownership program, wheels-towork program.  Agency Name: [facility] [employer] [phone]) Address: [address], [place], [place] [address] Phone: [phone] Website: [email or web address].[email or web address] Service(s) Offered: Transportation for [employer], subscription and demand response; Dial-a-Ride for citizens 41 years of age or older.  Agency Name: Department of Social Services Address: [address] [place], [place] [address] Phone: [phone] Service(s) Offered: Child support services; child welfare services; food stamps; Medicaid; work first family assistance; and aid with fuel,  rent, food and medicine, transportation assistance.  Agency Name: Disabled [employer] (DAV) Transportation  Network Phone: [phone] Service(s) Offered: Transports veterans to the [facility]. Call  forty-eight hours in advance and leave the name, telephone  number, date, and time of appointment. Veteran will be  contacted by the driver the day before the appointment to   arrange a pick up point   Transportation Resources  Agency Name: [facility] Agency Address: [address] [place], [place] [address] Phone: [phone] Email: [email or web address] Website: www.[email or web address] Service(s) Offered: Housing services, self-sufficiency, congregate meal program, weatherization program, Field seismologist program, emergency food assistance,  housing counseling, home ownership program, wheels-towork program.  Agency Name: [facility] [employer] [phone]) Address: [address] [place], [place] [address] Phone: [phone] Website: [email or web address].[email or web address] Service(s) Offered: Transportation for [employer], subscription and demand response; Dial-a-Ride for citizens 74 years of age or older.  Agency Name: Department of Social Services Address: [address] [place], [place] [address] Phone: [phone] Service(s) Offered: Child support services; child welfare services; food stamps; Medicaid; work first family assistance; and aid with fuel,  rent, food and medicine, transportation assistance.  Agency  Name: Disabled [address] (DAV) Transportation  Network Phone: [phone] Service(s) Offered: Transports veterans to the [facility] medical center. Call  forty-eight hours in advance and leave the name, telephone  number, date, and time of appointment. Veteran will be  contacted by the driver the day before the appointment to  arrange a pick up point    [employer] ACTA currently provides door to door services. ACTA connects with PART daily for services to [facility]. ACTA also performs contract services to [employer] operates 27 vehicles, all but 3 mini-vans are equipped with lifts for special needs as well as the general public. ACTA drivers are each CDL certified and trained in First Aid and CPR. ACTA was established in 2002 by [employer]. An independent Industrial/product designer. ACTA operates via Cytogeneticist with required Research scientist (physical sciences) from [place]. ACTA provides over 80,000 passenger trips each year, including [facility] and [employer] sites.  Call at least by 11 AM one business day prior to needing transportation  [employer].                      [place], [place] [address]     Office Hours: Monday-Friday  8 AM - 5 PM  Food Resources  Agency Name: [facility] Agency Address: [address] [place], [place] [address] Phone: [phone] Website: www.[email or web address] Service(s) Offered: Housing services, self-sufficiency, congregate meal program, weatherization program, Event organiser program, emergency food assistance,  housing counseling, home ownership program, wheels - to work program.  [employer] Food free for 60 and older at various locations from [employer], Monday-Friday:  [employer], [address] [place], [phone] -[facility], [address], [place] [phone]  -[facility], [address] [place] [phone]  -[address], [place], [phone]  Agency Name: [facility] Address: [phone] [address], [place], [place] [address] Phone: [phone] Website: www.[email or web address] Service(s) Offered: Home delivered hot, frozen, and emergency  meals. Grocery assistance program which matches  volunteers one-on-one with seniors unable to grocery shop  for themselves. Must be 60 years and older; less than 20  hours of in-home aide service, limited or no driving ability;  live alone or with someone with a disability; live in  [place].  Agency Name: Ecologist [facility] [employer]) Address: [address], [place], [place] [address] Phone:  [phone] Service(s) Offered: Food is served to shut-ins, homeless, elderly, and low income people in the community every Saturday (11:30 am-12:30 pm) and Sunday (12:30 pm-1:30pm). Volunteers also offer help and encouragement in seeking employment,  and spiritual guidance.  Agency Name: Department of Social Services Address: [address] [place], [place] [address] Phone: [phone] Service(s) Offered: Child support services; child welfare services; food stamps; Medicaid; work first family assistance; and aid with fuel,  rent, food and medicine.  Agency Name: Dietitian Address: [address], [place], [place] Phone: [phone] Website: www.[email or web address] Services Offered: Monday 10:00am-12:00, 8:00pm-9:00pm, and Friday 10:00am-12:00.  Agency Name: [employer] [facility] [place] Address: [address], [place], [place] [address] Phone: [phone] Website: www.[email or web address] Service(s) Offered: Serves weekday meals, open from 11:30 am- 1:00 pm., and 6:30-7:30pm, Monday-Wednesday-Friday distributes food 3:30-6pm, Monday-Wednesday-Friday.  Agency Name: [facility] Address: [address] [place], [place] Phone: [phone] Website: [email or web address] Services Offered: Distributes food the 4th  Saturday of the month, starting at 8:00 am  Agency Name: [facility] Address: [phone] [address], [place], [place] [address] Phone: [phone] Website: [email or web address] Service(s) Offered: Bread of life, weekly food pantry. Open Wednesdays from 10:00am-noon.  Agency Name: [facility] [employer] Address: [address] [place], [place], [place] Phone: [phone] Services Offered: Distributes food 9am-1pm, Monday-Thursday. Call for details.  Agency Name: [facility] Address: [address], [place], [place] [address] Phone: [phone] Website:  [email or web address] Service(s) Offered: Games developer. Call for assistance.  Agency Name: [facility] Address: [address] [place], [place] [address] Phone: [phone] Service Offered: Emergency Food Pantry. Call for appointment.  Agency Name: [facility] Address: [address] [place], [place] [address] Phone: [phone] Website: [email or web address] Services Offered: Games developer. Call for details  Agency Name: [facility] Address: [address] [place], [place] Phone: [phone] Website: [email or web address] Service(s) Offered: Emergency Food Pantry. Call for details.  Agency Name: Holiday representative Address: [address], [place], [place] [address] Phone: [phone] or [phone] Website: [email or web address] Service(s) Offered: Distribute food 9am-11:30 am, Tuesday-Friday, and 1-3:30pm, Monday-Friday. Food pantry Monday-Friday 1pm-3pm, fresh items, Mon.-Wed.-Fri.  Agency Name: [facility] ([facility]) Address: [address] [place], [place] [address] Phone: [phone] Website: www.[email or web address] Services Offered: Distribute food Tues and Sats from 9:00am-noon. Closed 1st Saturday of each month. Call for details  Agency Name: [employer] Address: [facility] [address] [place] [address] Phone: [phone]  Services Offered: Delivers meals every Thursday

## 2023-08-09 NOTE — Progress Notes (Addendum)
  Subjective: 2 Days Post-Op Procedure(s) (LRB): IRRIGATION AND DEBRIDEMENT WOUND (Right) Patient reports pain as mild.  Patient was asleep when I entered the room. Patient feels that the pain is getting better in the hand. Plan is to go Home after hospital stay. Negative for chest pain and shortness of breath Fever: no Gastrointestinal:Negative for nausea and vomiting  Objective: Vital signs in last 24 hours: Temp:  [98.3 F (36.8 C)-98.9 F (37.2 C)] 98.7 F (37.1 C) (03/19 0831) Pulse Rate:  [73-92] 73 (03/19 0831) Resp:  [16-18] 18 (03/19 0831) BP: (103-142)/(59-73) 142/73 (03/19 0831) SpO2:  [100 %] 100 % (03/19 0831)  Intake/Output from previous day:  Intake/Output Summary (Last 24 hours) at 08/09/2023 0903 Last data filed at 08/08/2023 2206 Gross per 24 hour  Intake 360 ml  Output --  Net 360 ml    Intake/Output this shift: No intake/output data recorded.  Labs: Recent Labs    08/07/23 0645 08/08/23 0426 08/09/23 0514  HGB 11.4* 11.1* 11.4*   Recent Labs    08/08/23 0426 08/09/23 0514  WBC 7.0 4.5  RBC 3.84* 3.87  HCT 33.5* 34.3*  PLT 179 199   Recent Labs    08/08/23 0426 08/09/23 0514  NA 137 139  K 3.9 3.8  CL 108 112*  CO2 23 24  BUN 10 15  CREATININE 0.51 0.56  GLUCOSE 118* 91  CALCIUM 8.5* 7.9*   No results for input(s): "LABPT", "INR" in the last 72 hours.   EXAM General - Patient is Alert, Appropriate, and Oriented Extremity - Bulky dressing intact to the right hand. Dressing removed, still moderate swelling to the hand and ring finger. Erythema improved per the patient.  Noted erythema along the radial and ulnar aspect of the ring finger, small blister formation, does not appear to be purulent this AM. No purulent material could be expressed. Drains were removed. New bulky dressing applied. Able to gently flex and extend fingers, intact cap refill.     Past Medical History:  Diagnosis Date   Anxiety and depression    IV  drug user     Assessment/Plan: 2 Days Post-Op Procedure(s) (LRB): IRRIGATION AND DEBRIDEMENT WOUND (Right) Principal Problem:   Cellulitis Active Problems:   Polysubstance abuse (HCC)   Transaminitis  Estimated body mass index is 25.97 kg/m as calculated from the following:   Height as of this encounter: 5\' 7"  (1.702 m).   Weight as of this encounter: 75.2 kg. Advance diet Up with therapy D/C IV fluids  Labs and vitals reviewed this.  WBC down to 4.5 this AM. Dressing change performed, drains were removed this AM. Cultures from surgery are pending at this time.  Continue IV Vancomycin and Rocephin Encouraged her to work on gentle ROM of the fingers. Continue to keep right hand elevated above heart level.  DVT Prophylaxis - Lovenox Non-weightbearing to the right hand.  Valeria Batman, PA-C [facility] 08/09/2023, 9:03 AM

## 2023-08-09 NOTE — Plan of Care (Signed)

## 2023-08-09 NOTE — Progress Notes (Signed)
  Progress Note   Date: 08/09/2023  Patient Name: Emily Hutchinson        MRN#: [id number]  Clarification of the debridement completed on 08/09/2023:  I performed an excisional debridement of the abscess and overlying necrotic skin involving the right ring finger.  I also did an excisional debridement of some right ring flexor tenosynovium associated with her suppurative flexor tenosynovitis involving the right ring finger.  Finally, I did a nonexcisional irrigation debridement of the dorsum of her hand.  Thank you!
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
## 2023-08-10 DIAGNOSIS — A4902 Methicillin resistant Staphylococcus aureus infection, unspecified site: Secondary | ICD-10-CM

## 2023-08-10 DIAGNOSIS — M65141 Other infective (teno)synovitis, right hand: Secondary | ICD-10-CM

## 2023-08-10 DIAGNOSIS — L02511 Cutaneous abscess of right hand: Secondary | ICD-10-CM

## 2023-08-10 LAB — CBC
HCT: 36.5 % (ref 36.0–46.0)
Hemoglobin: 12.2 g/dL (ref 12.0–15.0)
MCH: 29.2 pg (ref 26.0–34.0)
MCHC: 33.4 g/dL (ref 30.0–36.0)
MCV: 87.3 fL (ref 80.0–100.0)
Platelets: 219 10*3/uL (ref 150–400)
RBC: 4.18 MIL/uL (ref 3.87–5.11)
RDW: 14.6 % (ref 11.5–15.5)
WBC: 4 10*3/uL (ref 4.0–10.5)
nRBC: 0 % (ref 0.0–0.2)

## 2023-08-10 MED ORDER — METHADONE HCL 10 MG PO TABS
10.0000 mg | ORAL_TABLET | Freq: Once | ORAL | Status: AC
  Administered 2023-08-10: 10 mg via ORAL
  Filled 2023-08-10: qty 1

## 2023-08-10 MED ORDER — METHADONE HCL 10 MG PO TABS: 10.0000 mg | ORAL_TABLET | Freq: Once | ORAL | Status: DC

## 2023-08-10 MED ORDER — METHADONE HCL 10 MG PO TABS
10.0000 mg | ORAL_TABLET | Freq: Two times a day (BID) | ORAL | Status: DC
Start: 2023-08-10 — End: 2023-08-14
  Administered 2023-08-10 – 2023-08-14 (×9): 10 mg via ORAL
  Filled 2023-08-10 (×9): qty 1

## 2023-08-10 MED ORDER — LINEZOLID 600 MG/300ML IV SOLN
600.0000 mg | Freq: Two times a day (BID) | INTRAVENOUS | Status: DC
Start: 2023-08-10 — End: 2023-08-14
  Administered 2023-08-10 – 2023-08-13 (×7): 600 mg via INTRAVENOUS
  Filled 2023-08-10 (×8): qty 300

## 2023-08-10 NOTE — Consult Note (Signed)
 Pharmacy Antibiotic Note  Emily Hutchinson is a 43 y.o. female admitted on 08/07/2023 with cellulitis. Patient presented to ED with right hand swelling.  Patient does have history of IVDU, but denies injection into hand. Pharmacy has been consulted for vancomycin dosing.  Plan: Continue Vancomycin 1 gram IV every 12 hours Estimated AUC 471, Cmin 12.8 IBW, Scr rounded to 0.8, Vd 0.72 Will order vancomycin levels as follows: Vancomycin peak: 3/20 @ 2330 Vancomycin trough: 3/21 @ 0830 Follow renal function and cultures for adjustments  Height: 5\' 7"  (170.2 cm) Weight: 75.2 kg (165 lb 12.6 oz) IBW/kg (Calculated) : 61.6  Temp (24hrs), Avg:98.6 F (37 C), Min:98.1 F (36.7 C), Max:99.5 F (37.5 C)  Recent Labs  Lab 08/07/23 0645 08/07/23 0934 08/08/23 0426 08/09/23 0514  WBC 8.9  --  7.0 4.5  CREATININE 0.67  --  0.51 0.56  LATICACIDVEN 0.9 0.6  --   --     Estimated Creatinine Clearance: 96.9 mL/min (by C-G formula based on SCr of 0.56 mg/dL).    Allergies  Allergen Reactions   Penicillins     Antimicrobials this admission: vancomycin 3/17 >>  cefepime 3/17 >> 3/18 Ceftriaxone 3/18>>3/19 Flagyl x 1 3/17  Dose adjustments this admission: N/A  Microbiology results: 3/17 BCx: ngtd 3/17 Surgical cultures from right hand: MRSA  Thank you for allowing pharmacy to be a part of this patient's care.  Barrie Folk, PharmD 08/10/2023 10:50 AM

## 2023-08-10 NOTE — Plan of Care (Signed)

## 2023-08-10 NOTE — Progress Notes (Signed)
  Subjective: 3 Days Post-Op Procedure(s) (LRB): IRRIGATION AND DEBRIDEMENT WOUND (Right) Patient reports pain as mild.  Patient feels like her hand is better. She is moving her fingers easier today. Plan is to go Home after hospital stay. Negative for chest pain and shortness of breath Fever: no Gastrointestinal:Negative for nausea and vomiting  Objective: Vital signs in last 24 hours: Temp:  [98.1 F (36.7 C)-98.4 F (36.9 C)] 98.2 F (36.8 C) (03/20 1515) Pulse Rate:  [62-92] 67 (03/20 1515) Resp:  [18-20] 18 (03/20 1515) BP: (108-139)/(72-82) 118/72 (03/20 1515) SpO2:  [100 %] 100 % (03/20 1515)  Intake/Output from previous day:  Intake/Output Summary (Last 24 hours) at 08/10/2023 1750 Last data filed at 08/09/2023 1900 Gross per 24 hour  Intake 480 ml  Output --  Net 480 ml    Intake/Output this shift: No intake/output data recorded.  Labs: Recent Labs    08/08/23 0426 08/09/23 0514 08/10/23 1124  HGB 11.1* 11.4* 12.2   Recent Labs    08/09/23 0514 08/10/23 1124  WBC 4.5 4.0  RBC 3.87 4.18  HCT 34.3* 36.5  PLT 199 219   Recent Labs    08/08/23 0426 08/09/23 0514  NA 137 139  K 3.9 3.8  CL 108 112*  CO2 23 24  BUN 10 15  CREATININE 0.51 0.56  GLUCOSE 118* 91  CALCIUM 8.5* 7.9*   No results for input(s): "LABPT", "INR" in the last 72 hours.   EXAM General - Patient is Alert, Appropriate, and Oriented Extremity - Bulky dressing intact to the right hand. Dressing removed, swelling improved to the hand and ring finger.   Erythema has improved.  Blister has drained at this time. No purulent material could be expressed. New bulky dressing applied. Able to gently flex and extend fingers, intact cap refill.  Much better motion today.         Past Medical History:  Diagnosis Date   Anxiety and depression    IV drug user     Assessment/Plan: 3 Days Post-Op Procedure(s) (LRB): IRRIGATION AND DEBRIDEMENT WOUND (Right) Principal  Problem:   Cellulitis Active Problems:   Polysubstance abuse (HCC)   Transaminitis  Estimated body mass index is 25.97 kg/m as calculated from the following:   Height as of this encounter: 5\' 7"  (1.702 m).   Weight as of this encounter: 75.2 kg. Advance diet Up with therapy D/C IV fluids  Labs and vitals reviewed this.  WBC down to 4.0 this AM. Dressing change performed, continue dry dressing changes as needed. Cultures from surgery demonstrated MRSA.  Continue IV Linzolid.  ID Consulted Encouraged her to work on gentle ROM of the fingers. Continue to keep right hand elevated above heart level.  Following discharge, follow-up with [facility] orthopaedics in 3-5 days for skin check.  DVT Prophylaxis - Lovenox Non-weightbearing to the right hand.  Valeria Batman, PA-C [facility] Orthopaedic Surgery 08/10/2023, 5:50 PM

## 2023-08-10 NOTE — Consult Note (Signed)
 NAME: Emily Hutchinson  DOB: February 05, 1981  MRN: [id number]  Date/Time: 08/10/2023 2:51 PM  REQUESTING PROVIDER: [email or web address] Subjective:  REASON FOR CONSULT: MRSA infection rt hand ? Emily Hutchinson is a 43 y.o. female with a history of polysubstance use, IVDA, h/o MRSA presents with  rt hand swelling and pain for a few days. She denied injection to the hand but had mainlined in [facility] fossa Vitals in the ED  08/07/23  BP 104/61  Temp 98 F (36.7 C)  Pulse Rate 62  Resp 16  SpO2 98 %   Labs   Latest Reference Range & Units 08/07/23  WBC 4.0 - 10.5 K/uL 8.9  Hemoglobin 12.0 - 15.0 g/dL 78.2 (L)  HCT 95.6 - 21.3 % 34.1 (L)  Platelets 150 - 400 K/uL 184  Creatinine 0.44 - 1.00 mg/dL 0.86   CT hand showed  Prominent focal soft tissue swelling within the volar aspect of the proximal ring finger with mild surrounding heterogeneous soft tissue enhancement, suspicious for cellulitis. Possible associated flexor tenosynovitis.  She was seen by ortho and taken for surgery on 3/17 and underwent I/D of ring finger abscess, I/D of the suppurative flexor tenosynovitis and I/D of dorsumof hand- pt was placed on vanco Culture is MRSA and I am asked to see patient She is feeling okay Past Medical History:  Diagnosis Date   Anxiety and depression    IV drug user     Past Surgical History:  Procedure Laterality Date   CESAREAN SECTION     CHOLECYSTECTOMY     INCISION AND DRAINAGE OF WOUND Right 08/07/2023   Procedure: IRRIGATION AND DEBRIDEMENT WOUND;  Surgeon: Christena Flake, MD;  Location: [facility] ORS;  Service: Orthopedics;  Laterality: Right;  RIGHT HAND AND FOREARM    Social History   Socioeconomic History   Marital status: Single    Spouse name: Not on file   Number of children: Not on file   Years of education: Not on file   Highest education level: Not on file  Occupational History   Not on file  Tobacco Use   Smoking status: Every Day    Current packs/day: 1.00    Types: Cigarettes   Smokeless  tobacco: Never  Vaping Use   Vaping status: Every Day  Substance and Sexual Activity   Alcohol use: No   Drug use: Yes    Types: IV, Methamphetamines    Comment: fentanyl,   Sexual activity: Yes    Comment: methadone  Other Topics Concern   Not on file  Social History Narrative   Not on file   Social Drivers of Health   Financial Resource Strain: Not on file  Food Insecurity: Food Insecurity Present (08/07/2023)   Hunger Vital Sign    Worried About Running Out of Food in the Last Year: Often true    Ran Out of Food in the Last Year: Often true  Transportation Needs: Unmet Transportation Needs (08/07/2023)   PRAPARE - Administrator, Civil Service (Medical): Yes    Lack of Transportation (Non-Medical): Yes  Physical Activity: Not on file  Stress: Not on file  Social Connections: Socially Isolated (08/07/2023)   Social Connection and Isolation Panel [NHANES]    Frequency of Communication with Friends and Family: Three times a week    Frequency of Social Gatherings with Friends and Family: More than three times a week    Attends Religious Services: Never    Database administrator or Organizations: No  Attends Banker Meetings: Never    Marital Status: Never married  Intimate Partner Violence: Not At Risk (08/07/2023)   Humiliation, Afraid, Rape, and Kick questionnaire    Fear of Current or Ex-Partner: No    Emotionally Abused: No    Physically Abused: No    Sexually Abused: No    History reviewed. No pertinent family history. Allergies  Allergen Reactions   Penicillins    I? Current Facility-Administered Medications  Medication Dose Route Frequency Provider Last Rate Last Admin   enoxaparin (LOVENOX) injection 40 mg  40 mg Subcutaneous Q24H Floydene Flock, MD   40 mg at 08/10/23 1033   fentaNYL (SUBLIMAZE) injection 25 mcg  25 mcg Intravenous BID PRN Darlin Priestly, MD   25 mcg at 08/09/23 1010   LORazepam (ATIVAN) injection 1 mg  1 mg Intravenous  Once PRN Darlin Priestly, MD       methadone (DOLOPHINE) tablet 10 mg  10 mg Oral BID Darlin Priestly, MD   10 mg at 08/10/23 1033   nicotine (NICODERM CQ - dosed in mg/24 hours) patch 21 mg  21 mg Transdermal Daily Darlin Priestly, MD   21 mg at 08/10/23 1035   ondansetron (ZOFRAN) tablet 4 mg  4 mg Oral Q6H PRN Floydene Flock, MD       Or   ondansetron [facility]) injection 4 mg  4 mg Intravenous Q6H PRN Floydene Flock, MD   4 mg at 08/07/23 1525   oxyCODONE (Oxy IR/ROXICODONE) immediate release tablet 5-10 mg  5-10 mg Oral Q4H PRN Darlin Priestly, MD   10 mg at 08/10/23 1031   vancomycin (VANCOCIN) IVPB 1000 mg/200 mL premix  1,000 mg Intravenous Q12H Barrie Folk, RPH 200 mL/hr at 08/10/23 1034 1,000 mg at 08/10/23 1034   vancomycin (VANCOREADY) IVPB 750 mg/150 mL  750 mg Intravenous Once Barrie Folk, RPH         Abtx:  Anti-infectives (From admission, onward)    Start     Dose/Rate Route Frequency Ordered Stop   08/08/23 1400  cefTRIAXone (ROCEPHIN) 2 g in sodium chloride 0.9 % 100 mL IVPB  Status:  Discontinued        2 g 200 mL/hr over 30 Minutes Intravenous Every 24 hours 08/08/23 1151 08/09/23 1434   08/07/23 2100  vancomycin (VANCOCIN) IVPB 1000 mg/200 mL premix  Status:  Discontinued        1,000 mg 200 mL/hr over 60 Minutes Intravenous Every 12 hours 08/07/23 0945 08/07/23 1348   08/07/23 2100  vancomycin (VANCOCIN) IVPB 1000 mg/200 mL premix        1,000 mg 200 mL/hr over 60 Minutes Intravenous Every 12 hours 08/07/23 1349     08/07/23 1500  ceFEPIme (MAXIPIME) 2 g in sodium chloride 0.9 % 100 mL IVPB  Status:  Discontinued        2 g 200 mL/hr over 30 Minutes Intravenous Every 8 hours 08/07/23 0921 08/08/23 1151   08/07/23 1030  vancomycin (VANCOREADY) IVPB 750 mg/150 mL        750 mg 150 mL/hr over 60 Minutes Intravenous  Once 08/07/23 0921     08/07/23 0630  ceFEPIme (MAXIPIME) 2 g in sodium chloride 0.9 % 100 mL IVPB        2 g 200 mL/hr over 30 Minutes Intravenous  Once  08/07/23 0619 08/07/23 0849   08/07/23 0630  metroNIDAZOLE (FLAGYL) IVPB 500 mg        500 mg  100 mL/hr over 60 Minutes Intravenous  Once 08/07/23 1610 08/07/23 0849   08/07/23 0630  vancomycin (VANCOCIN) IVPB 1000 mg/200 mL premix        1,000 mg 200 mL/hr over 60 Minutes Intravenous  Once 08/07/23 9604 08/07/23 0919       REVIEW OF SYSTEMS:  Const: negative fever, negative chills, negative weight loss Eyes: negative diplopia or visual changes, negative eye pain ENT: negative coryza, negative sore throat Resp: negative cough, hemoptysis, dyspnea Cards: negative for chest pain, palpitations, lower extremity edema GU: negative for frequency, dysuria and hematuria GI: Negative for abdominal pain, diarrhea, bleeding, constipation Skin: negative for rash and pruritus Heme: negative for easy bruising and gum/nose bleeding MS:  weakness Neurolo:negative for headaches, dizziness, vertigo, memory problems  Psych: negative for feelings of anxiety, depression  Endocrine: negative for thyroid, diabetes Allergy/Immunology- penicillin  Objective:  VITALS:  BP 128/82 (BP Location: Right Arm)   Pulse 70   Temp 98.4 F (36.9 C)   Resp 18   Ht 5\' 7"  (1.702 m)   Wt 75.2 kg   LMP 07/10/2023 (Approximate)   SpO2 100%   BMI 25.97 kg/m   PHYSICAL EXAM:  General: Alert, cooperative, no distress, appears stated age.  Head: Normocephalic, without obvious abnormality, atraumatic. Eyes: Conjunctivae clear, anicteric sclerae. Pupils are equal ENT Nares normal. No drainage or sinus tenderness. Lips, mucosa, and tongue normal. No Thrush Neck: Supple, symmetrical, no adenopathy, thyroid: non tender no carotid bruit and no JVD. Back: No CVA tenderness. Lungs: Clear to auscultation bilaterally. No Wheezing or Rhonchi. No rales. Heart: Regular rate and rhythm, no murmur, rub or gallop. Abdomen: Soft, non-tender,not distended. Bowel sounds normal. No masses Extremities:   08/07/23   08/07/23    3/18   3/18   3/19   3/19   Skin: No rashes or lesions. Or bruising Lymph: Cervical, supraclavicular normal. Neurologic: Grossly non-focal Pertinent Labs Lab Results CBC    Component Value Date/Time   WBC 4.0 08/10/2023 1124   RBC 4.18 08/10/2023 1124   HGB 12.2 08/10/2023 1124   HCT 36.5 08/10/2023 1124   PLT 219 08/10/2023 1124   MCV 87.3 08/10/2023 1124   MCH 29.2 08/10/2023 1124   MCHC 33.4 08/10/2023 1124   RDW 14.6 08/10/2023 1124   LYMPHSABS 1.3 08/07/2023 0645   MONOABS 0.8 08/07/2023 0645   EOSABS 0.1 08/07/2023 0645   BASOSABS 0.0 08/07/2023 0645       Latest Ref Rng & Units 08/09/2023    5:14 AM 08/08/2023    4:26 AM 08/07/2023    6:45 AM  CMP  Glucose 70 - 99 mg/dL 91  540  97   BUN 6 - 20 mg/dL 15  10  9    Creatinine 0.44 - 1.00 mg/dL 9.81  1.91  4.78   Sodium 135 - 145 mmol/L 139  137  133   Potassium 3.5 - 5.1 mmol/L 3.8  3.9  3.2   Chloride 98 - 111 mmol/L 112  108  100   CO2 22 - 32 mmol/L 24  23  24    Calcium 8.9 - 10.3 mg/dL 7.9  8.5  8.4   Total Protein 6.5 - 8.1 g/dL  6.2  6.7   Total Bilirubin 0.0 - 1.2 mg/dL  1.0  1.1   Alkaline Phos 38 - 126 U/L  162  206   AST 15 - 41 U/L  64  236   ALT 0 - 44 U/L  98  156  Microbiology: Recent Results (from the past 240 hours)  Blood Culture (routine x 2)     Status: None (Preliminary result)   Collection Time: 08/07/23  6:45 AM   Specimen: BLOOD RIGHT ARM  Result Value Ref Range Status   Specimen Description BLOOD RIGHT ARM  Final   Special Requests   Final    BOTTLES DRAWN AEROBIC AND ANAEROBIC Blood Culture results may not be optimal due to an inadequate volume of blood received in culture bottles   Culture   Final    NO GROWTH 3 DAYS Performed at [facility], [address], [place], [place] [address]    Report Status PENDING  Incomplete  Blood Culture (routine x 2)     Status: None (Preliminary result)   Collection Time: 08/07/23  6:45 AM    Specimen: BLOOD LEFT ARM  Result Value Ref Range Status   Specimen Description BLOOD LEFT ARM  Final   Special Requests   Final    BOTTLES DRAWN AEROBIC AND ANAEROBIC Blood Culture results may not be optimal due to an inadequate volume of blood received in culture bottles   Culture   Final    NO GROWTH 3 DAYS Performed at [facility], [address] [place], [place] [address]    Report Status PENDING  Incomplete  Aerobic/Anaerobic Culture w Gram Stain (surgical/deep wound)     Status: None (Preliminary result)   Collection Time: 08/07/23  3:41 PM   Specimen: Wound  Result Value Ref Range Status   Specimen Description   Final    WOUND Performed at [facility], [address], [place], [place] [address]    Special Requests RIGHT RING FINGER SPEC A  Final   Gram Stain   Final    NO WBC SEEN RARE GRAM POSITIVE COCCI Performed at [facility], [address], [place], [place] [address]    Culture   Final    ABUNDANT METHICILLIN RESISTANT STAPHYLOCOCCUS AUREUS NO ANAEROBES ISOLATED; CULTURE IN PROGRESS FOR 5 DAYS    Report Status PENDING  Incomplete   Organism ID, Bacteria METHICILLIN RESISTANT STAPHYLOCOCCUS AUREUS  Final      Susceptibility   Methicillin resistant staphylococcus aureus - MIC*    CIPROFLOXACIN >=8 RESISTANT Resistant     ERYTHROMYCIN >=8 RESISTANT Resistant     GENTAMICIN <=0.5 SENSITIVE Sensitive     OXACILLIN >=4 RESISTANT Resistant     TETRACYCLINE <=1 SENSITIVE Sensitive     VANCOMYCIN 1 SENSITIVE Sensitive     TRIMETH/SULFA >=320 RESISTANT Resistant     CLINDAMYCIN <=0.25 SENSITIVE Sensitive     RIFAMPIN <=0.5 SENSITIVE Sensitive     Inducible Clindamycin NEGATIVE Sensitive     LINEZOLID 2 SENSITIVE Sensitive     * ABUNDANT METHICILLIN RESISTANT STAPHYLOCOCCUS AUREUS  Aerobic/Anaerobic Culture w Gram Stain (surgical/deep wound)     Status: None (Preliminary result)   Collection Time: 08/07/23  3:43 PM   Specimen: Wound   Result Value Ref Range Status   Specimen Description   Final    WOUND Performed at [facility], [address] [place], [place] [address]    Special Requests RIGHT RING FINGER SPEC B  Final   Gram Stain   Final    NO WBC SEEN RARE GRAM POSITIVE COCCI IN PAIRS Performed at [facility], [address], [place], [place] [address]    Culture   Final    ABUNDANT METHICILLIN RESISTANT STAPHYLOCOCCUS AUREUS NO ANAEROBES ISOLATED;  CULTURE IN PROGRESS FOR 5 DAYS    Report Status PENDING  Incomplete   Organism ID, Bacteria METHICILLIN RESISTANT STAPHYLOCOCCUS AUREUS  Final      Susceptibility   Methicillin resistant staphylococcus aureus - MIC*    CIPROFLOXACIN >=8 RESISTANT Resistant     ERYTHROMYCIN >=8 RESISTANT Resistant     GENTAMICIN <=0.5 SENSITIVE Sensitive     OXACILLIN >=4 RESISTANT Resistant     TETRACYCLINE <=1 SENSITIVE Sensitive     VANCOMYCIN 1 SENSITIVE Sensitive     TRIMETH/SULFA >=320 RESISTANT Resistant     CLINDAMYCIN <=0.25 SENSITIVE Sensitive     RIFAMPIN <=0.5 SENSITIVE Sensitive     Inducible Clindamycin NEGATIVE Sensitive     LINEZOLID 2 SENSITIVE Sensitive     * ABUNDANT METHICILLIN RESISTANT STAPHYLOCOCCUS AUREUS  Aerobic/Anaerobic Culture w Gram Stain (surgical/deep wound)     Status: None (Preliminary result)   Collection Time: 08/07/23  3:44 PM   Specimen: Wound; Tissue  Result Value Ref Range Status   Specimen Description TISSUE  Final   Special Requests RIGHT RING FINGER SPEC C  Final   Gram Stain   Final    NO WBC SEEN RARE GRAM POSITIVE COCCI Performed at [facility], [address] [place], [place] [address]    Culture   Final    MODERATE METHICILLIN RESISTANT STAPHYLOCOCCUS AUREUS NO ANAEROBES ISOLATED; CULTURE IN PROGRESS FOR 5 DAYS    Report Status PENDING  Incomplete   Organism ID, Bacteria METHICILLIN RESISTANT STAPHYLOCOCCUS AUREUS  Final      Susceptibility   Methicillin resistant staphylococcus aureus -  MIC*    CIPROFLOXACIN >=8 RESISTANT Resistant     ERYTHROMYCIN >=8 RESISTANT Resistant     GENTAMICIN <=0.5 SENSITIVE Sensitive     OXACILLIN >=4 RESISTANT Resistant     TETRACYCLINE <=1 SENSITIVE Sensitive     VANCOMYCIN 1 SENSITIVE Sensitive     TRIMETH/SULFA >=320 RESISTANT Resistant     CLINDAMYCIN <=0.25 SENSITIVE Sensitive     RIFAMPIN <=0.5 SENSITIVE Sensitive     Inducible Clindamycin NEGATIVE Sensitive     LINEZOLID 2 SENSITIVE Sensitive     * MODERATE METHICILLIN RESISTANT STAPHYLOCOCCUS AUREUS  Aerobic/Anaerobic Culture w Gram Stain (surgical/deep wound)     Status: None (Preliminary result)   Collection Time: 08/07/23  4:02 PM   Specimen: Wound  Result Value Ref Range Status   Specimen Description   Final    WOUND Performed at [facility], [address], [place], [place] [address]    Special Requests RIGHT DORSAL HAND  Final   Gram Stain   Final    NO WBC SEEN NO ORGANISMS SEEN Performed at [facility], [address], [place], [place] [address]    Culture   Final    RARE STAPHYLOCOCCUS AUREUS SUSCEPTIBILITIES TO FOLLOW NO ANAEROBES ISOLATED; CULTURE IN PROGRESS FOR 5 DAYS    Report Status PENDING  Incomplete    IMAGING RESULTS: CT of the hand As above I have personally reviewed the films ? Impression/Recommendation ? MRSA abscess of rt ringer finger and hand MRSA tenosynovitis of the flexor tendon of the ring finger S/p I/D? Pt is on vanco Will change to linezolid for better soft tissue penetration and antitoxin effect  Polysubstance use- crystal meth, fentanyl and heroin  IVDA  H/o MRSA Hep c antibody positive will check RNA   ________________________________________________ Discussed with patient, requesting provider Note:  This document was prepared using Dragon voice recognition software and may  include unintentional dictation errors.

## 2023-08-10 NOTE — Progress Notes (Signed)
  PROGRESS NOTE    Emily Hutchinson  LKG:[id number] DOB: Jun 30, 1980 DOA: 08/07/2023 PCP: Pcp, No  [id number]-[id number]  LOS: 3 days   Brief hospital course:   Assessment & Plan: Emily Hutchinson is a 43 y.o. female with medical history significant of polysubstance abuse, IV drug use presenting with right extremity cellulitis.  Patient reports significant right upper extremity swelling and pain over the past 2 to 3 days.  Does report using IV drug use in the affected arm roughly 2 to 3 days prior.  Denies any injections directly into the hand.     * Cellulitis of right hand --s/p OR I/D on 3/17 with cx obtained which is pos for MRSA.  Ceftriaxone d/c'ed. --cont Vanc --ID consult today  Transaminitis Hx of HCV exposure AST 236, ALT 156 on presentation  --hepatitis panel pos for HCV --ID consult today  Polysubstance abuse (HCC) Patient self admits to IV heroin use --current UDS pos for amphetamine.  Prior UDS was pos for cocaine.  Opioids withdrawal --increase methadone to 10 mg BID  Current smoker --nicotine patch   DVT prophylaxis: Lovenox SQ Code Status: Full code  Family Communication:  Level of care: Med-Surg Dispo:   The patient is from: home Anticipated d/c is to: home Anticipated d/c date is: undetermined   Subjective and Interval History:  Pt reported feeling much better with methadone.   Objective: Vitals:   08/10/23 0336 08/10/23 0807 08/10/23 1515 08/10/23 2048  BP: 108/74 128/82 118/72 118/77  Pulse: 62 70 67 83  Resp: 18 18 18 20   Temp: 98.2 F (36.8 C) 98.4 F (36.9 C) 98.2 F (36.8 C) 98.1 F (36.7 C)  TempSrc: Oral   Oral  SpO2: 100% 100% 100% 98%  Weight:      Height:       No intake or output data in the 24 hours ending 08/10/23 2109  Filed Weights   08/07/23 0556 08/07/23 1327  Weight: 75.2 kg 75.2 kg    Examination:   Constitutional: NAD, AAOx3 HEENT: conjunctivae and lids normal, EOMI CV: No cyanosis.   RESP: normal respiratory effort, on  RA Neuro: II - XII grossly intact.   Psych: better and calmer mood and affect.     Data Reviewed: I have personally reviewed labs and imaging studies  Time spent: 35 minutes  Darlin Priestly, MD [facility] If 7PM-7AM, please contact night-coverage 08/10/2023, 9:09 PM

## 2023-08-10 NOTE — Plan of Care (Signed)

## 2023-08-11 ENCOUNTER — Other Ambulatory Visit: Payer: Self-pay

## 2023-08-11 ENCOUNTER — Other Ambulatory Visit (HOSPITAL_COMMUNITY): Payer: Self-pay

## 2023-08-11 LAB — CBC
HCT: 36.9 % (ref 36.0–46.0)
Hemoglobin: 12.1 g/dL (ref 12.0–15.0)
MCH: 29.4 pg (ref 26.0–34.0)
MCHC: 32.8 g/dL (ref 30.0–36.0)
MCV: 89.6 fL (ref 80.0–100.0)
Platelets: 230 10*3/uL (ref 150–400)
RBC: 4.12 MIL/uL (ref 3.87–5.11)
RDW: 14.4 % (ref 11.5–15.5)
WBC: 4.9 10*3/uL (ref 4.0–10.5)
nRBC: 0 % (ref 0.0–0.2)

## 2023-08-11 LAB — BASIC METABOLIC PANEL
Anion gap: 8 (ref 5–15)
BUN: 15 mg/dL (ref 6–20)
CO2: 28 mmol/L (ref 22–32)
Calcium: 8.5 mg/dL — ABNORMAL LOW (ref 8.9–10.3)
Chloride: 103 mmol/L (ref 98–111)
Creatinine, Ser: 0.47 mg/dL (ref 0.44–1.00)
GFR, Estimated: >60 mL/min (ref >60)
Glucose, Bld: 84 mg/dL (ref 70–99)
Potassium: 4.3 mmol/L (ref 3.5–5.1)
Sodium: 139 mmol/L (ref 135–145)

## 2023-08-11 LAB — MAGNESIUM: Magnesium: 2.1 mg/dL (ref 1.7–2.4)

## 2023-08-11 MED ORDER — DOXYCYCLINE HYCLATE 100 MG PO TABS
100.0000 mg | ORAL_TABLET | Freq: Two times a day (BID) | ORAL | 0 refills | Status: AC
  Filled 2023-08-11: qty 28, 14d supply, fill #0

## 2023-08-11 NOTE — Progress Notes (Signed)
 Mobility Specialist - Progress Note   08/11/23 1609  Mobility  Activity Ambulated independently in hallway  Level of Assistance Independent  Assistive Device None  Distance Ambulated (ft) 80 ft  Activity Response Tolerated well  Mobility visit 1 Mobility  Mobility Specialist Start Time (ACUTE ONLY) 1601  Mobility Specialist Stop Time (ACUTE ONLY) 1608  Mobility Specialist Time Calculation (min) (ACUTE ONLY) 7 min   Zetta Bills Mobility Specialist 08/11/23 4:10 PM

## 2023-08-11 NOTE — TOC Progression Note (Signed)
 Transition of Care [facility]) - Progression Note    Patient Details  Name: Emily Hutchinson MRN: [id number] Date of Birth: 1980/10/24  Transition of Care [facility]) CM/SW Contact  Chapman Fitch, RN Phone Number: 08/11/2023, 4:39 PM  Clinical Narrative:     Discharge med is in main pharmacy        Expected Discharge Plan and Services                                               Social Determinants of Health (SDOH) Interventions SDOH Screenings   Food Insecurity: Food Insecurity Present (08/07/2023)  Housing: High Risk (08/07/2023)  Transportation Needs: Unmet Transportation Needs (08/07/2023)  Utilities: At Risk (08/07/2023)  Alcohol Screen: Low Risk  (04/01/2017)  Social Connections: Socially Isolated (08/07/2023)  Tobacco Use: High Risk (08/07/2023)    Readmission Risk Interventions     No data to display

## 2023-08-11 NOTE — Progress Notes (Signed)
 Date of Admission:  08/07/2023      ID: Emily Hutchinson is a 43 y.o. female  Principal Problem:   Cellulitis Active Problems:   Polysubstance abuse (HCC)   Transaminitis    Subjective: Pt s calmer today Started on Methadone by hospitalist  Medications:   enoxaparin (LOVENOX) injection  40 mg Subcutaneous Q24H   methadone  10 mg Oral BID   nicotine  21 mg Transdermal Daily    Objective: Vital signs in last 24 hours: Patient Vitals for the past 24 hrs:  BP Temp Temp src Pulse Resp SpO2  08/11/23 0840 119/73 98 F (36.7 C) Oral (!) 58 18 100 %  08/11/23 0402 127/67 98.5 F (36.9 C) -- 80 20 98 %  08/10/23 2048 118/77 98.1 F (36.7 C) Oral 83 20 98 %  08/10/23 1515 118/72 98.2 F (36.8 C) -- 67 18 100 %       PHYSICAL EXAM:  General: Alert, cooperative, no distress, appears stated age.  Lungs: Clear to auscultation bilaterally. No Wheezing or Rhonchi. No rales. Heart: Regular rate and rhythm, no murmur, rub or gallop. Abdomen: Soft, non-tender,not distended. Bowel sounds normal. No masses Extremities:     Some swelling of fingers Surgical site has some maceration Skin: No rashes or lesions. Or bruising Lymph: Cervical, supraclavicular normal. Neurologic: Grossly non-focal  Lab Results    Latest Ref Rng & Units 08/11/2023    4:39 AM 08/10/2023   11:24 AM 08/09/2023    5:14 AM  CBC  WBC 4.0 - 10.5 K/uL 4.9  4.0  4.5   Hemoglobin 12.0 - 15.0 g/dL 16.1  09.6  04.5   Hematocrit 36.0 - 46.0 % 36.9  36.5  34.3   Platelets 150 - 400 K/uL 230  219  199        Latest Ref Rng & Units 08/11/2023    4:39 AM 08/09/2023    5:14 AM 08/08/2023    4:26 AM  CMP  Glucose 70 - 99 mg/dL 84  91  409   BUN 6 - 20 mg/dL 15  15  10    Creatinine 0.44 - 1.00 mg/dL 8.11  9.14  7.82   Sodium 135 - 145 mmol/L 139  139  137   Potassium 3.5 - 5.1 mmol/L 4.3  3.8  3.9   Chloride 98 - 111 mmol/L 103  112  108   CO2 22 - 32 mmol/L 28  24  23    Calcium 8.9 - 10.3 mg/dL 8.5  7.9  8.5    Total Protein 6.5 - 8.1 g/dL   6.2   Total Bilirubin 0.0 - 1.2 mg/dL   1.0   Alkaline Phos 38 - 126 U/L   162   AST 15 - 41 U/L   64   ALT 0 - 44 U/L   98       Microbiology: MRSA   Assessment/Plan: MRSA abscess of rt ringer finger and hand MRSA tenosynovitis of the flexor tendon of the ring finger S/p I/D? Pt is on linezolid ( was on vanco before)  continue IV linezolid MRSA R to bactrim  Polysubstance use- crystal meth, fentanyl and heroin  cannot send her home on linezolid  due to interaction and risk for serotonin syndrome  So the antibiotic option on discharge would be  dose of IV oritavancin or dalbavancin and PO doxy for 2 weeks  IVDA   H/o MRSA Hep c antibody positive will check RNA   ID will not  see her this weekend On call physician available by phone for urgent issues

## 2023-08-11 NOTE — Plan of Care (Signed)

## 2023-08-11 NOTE — Plan of Care (Signed)

## 2023-08-11 NOTE — Progress Notes (Signed)
  PROGRESS NOTE    Emily Hutchinson  ZDG:[id number] DOB: 1981-01-03 DOA: 08/07/2023 PCP: Pcp, No  226A/226A-AA  LOS: 4 days   Brief hospital course:   Assessment & Plan: Emily Hutchinson is a 43 y.o. female with medical history significant of polysubstance abuse, IV drug use presenting with right extremity cellulitis.  Patient reports significant right upper extremity swelling and pain over the past 2 to 3 days.  Does report using IV drug use in the affected arm roughly 2 to 3 days prior.  Denies any injections directly into the hand.     MRSA abscess of rt ringer finger and hand MRSA tenosynovitis of the flexor tendon of the ring finger --s/p OR I/D on 3/17 with cx obtained which is pos for MRSA.  Started on vanc, cefepime and flagyl on presentation.  ID consulted, started on IV Linezolid.   --cont IV linezolid while inpatient (ID favors the longer on IV abx the better). --on the day of discharge, will give a dose of orbactiv, which will last a week, f/b 2 weeks of doxy. --Prefer to keep pt on IV Linezolid until early next week.  If pt wants to leave over the weekend, pls give orbactiv prior to discharge, and start doxy 1 week later (Rx doxy already filled and [facility]). --Following discharge, follow-up with [facility] orthopaedics in 3-5 days for skin check.  --Non-weightbearing to the right hand.   Transaminitis Hx of HCV exposure AST 236, ALT 156 on presentation  --hepatitis panel pos for HCV.  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 --ID consulted --check RNA  Polysubstance abuse (HCC) --crystal meth, fentanyl and heroin  --current UDS pos for amphetamine.  Prior UDS was pos for cocaine.  Opioids withdrawal --cont methadone 10 mg BID --pt is interested in continuing methadone after discharge.  Pls check Medicaid coverage for methadone.  Current smoker --nicotine patch   DVT prophylaxis: Lovenox SQ Code Status: Full code  Family Communication:  Level of care:  Med-Surg Dispo:   The patient is from: home Anticipated d/c is to: home Anticipated d/c date is: Monday    Subjective and Interval History:  Pain and swelling of hand much improved.  Pt reported doing well on methadone.     Objective: Vitals:   08/11/23 0402 08/11/23 0840 08/11/23 1559 08/11/23 2016  BP: 127/67 119/73 120/63 (!) 151/84  Pulse: 80 (!) 58 72 83  Resp: 20 18 20 20   Temp: 98.5 F (36.9 C) 98 F (36.7 C) 98.6 F (37 C) 98.7 F (37.1 C)  TempSrc:  Oral Oral Oral  SpO2: 98% 100% 100% 100%  Weight:      Height:        Intake/Output Summary (Last 24 hours) at 08/11/2023 2107 Last data filed at 08/11/2023 1900 Gross per 24 hour  Intake 2280 ml  Output --  Net 2280 ml    Filed Weights   08/07/23 0556 08/07/23 1327  Weight: 75.2 kg 75.2 kg    Examination:   Constitutional: NAD, AAOx3 HEENT: conjunctivae and lids normal, EOMI CV: No cyanosis.   RESP: normal respiratory effort, on RA Neuro: II - XII grossly intact.   Psych: better mood and affect.     Data Reviewed: I have personally reviewed labs and imaging studies  Time spent: 50 minutes  Darlin Priestly, MD [facility] If 7PM-7AM, please contact night-coverage 08/11/2023, 9:07 PM

## 2023-08-12 DIAGNOSIS — L03113 Cellulitis of right upper limb: Principal | ICD-10-CM

## 2023-08-12 DIAGNOSIS — F1910 Other psychoactive substance abuse, uncomplicated: Secondary | ICD-10-CM

## 2023-08-12 DIAGNOSIS — R7401 Elevation of levels of liver transaminase levels: Secondary | ICD-10-CM

## 2023-08-12 LAB — AEROBIC/ANAEROBIC CULTURE W GRAM STAIN (SURGICAL/DEEP WOUND)
Gram Stain: NONE SEEN
Gram Stain: NONE SEEN
Gram Stain: NONE SEEN
Gram Stain: NONE SEEN

## 2023-08-12 LAB — CULTURE, BLOOD (ROUTINE X 2)
Culture: NO GROWTH
Culture: NO GROWTH

## 2023-08-12 NOTE — Progress Notes (Signed)
 PROGRESS NOTE    Emily Hutchinson  ZOX:[id number] DOB: 13-Apr-1981 DOA: 08/07/2023 PCP: Pcp, No  226A/226A-AA  LOS: 5 days   Brief hospital course:  Emily Hutchinson is a 43 y.o. female with medical history significant of polysubstance abuse, IV drug use presenting with right extremity cellulitis.  Patient reports significant right upper extremity swelling and pain over the past 2 to 3 days.  Does report using IV drug use in the affected arm roughly 2 to 3 days prior.  Denies any injections directly into the hand.   3/22: Remained hemodynamically stable, continuing IV antibiotics over the weekend.  Assessment & Plan:   MRSA abscess of rt ringer finger and hand MRSA tenosynovitis of the flexor tendon of the ring finger --s/p OR I/D on 3/17 with cx obtained which is pos for MRSA.  Started on vanc, cefepime and flagyl on presentation.  ID consulted, started on IV Linezolid.   --cont IV linezolid while inpatient (ID favors the longer on IV abx the better). --on the day of discharge, will give a dose of orbactiv, which will last a week, f/b 2 weeks of doxy. --Prefer to keep pt on IV Linezolid until early next week.  If pt wants to leave over the weekend, pls give orbactiv prior to discharge, and start doxy 1 week later (Rx doxy already filled and [facility]). --Following discharge, follow-up with [facility] orthopaedics in 3-5 days for skin check.  --Non-weightbearing to the right hand.   Transaminitis Hx of HCV exposure AST 236, ALT 156 on presentation  --hepatitis panel pos for HCV.  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 --ID consulted --check RNA-pending results  Polysubstance abuse (HCC) --crystal meth, fentanyl and heroin  --current UDS pos for amphetamine.  Prior UDS was pos for cocaine.  Opioids withdrawal --cont methadone 10 mg BID --pt is interested in continuing methadone after discharge.  Pls check Medicaid coverage for methadone.  Current smoker --nicotine  patch   DVT prophylaxis: Lovenox SQ Code Status: Full code  Family Communication:  Level of care: Med-Surg Dispo:   The patient is from: home Anticipated d/c is to: home Anticipated d/c date is: Monday    Subjective and Interval History:  Patient was resting comfortably when seen today.  No new concern.  I agreed to stay over the weekend.   Objective: Vitals:   08/11/23 1559 08/11/23 2016 08/12/23 0502 08/12/23 0818  BP: 120/63 (!) 151/84 (!) 119/57 130/64  Pulse: 72 83 66 61  Resp: 20 20 20 15   Temp: 98.6 F (37 C) 98.7 F (37.1 C) 98.7 F (37.1 C) 99 F (37.2 C)  TempSrc: Oral Oral Oral Oral  SpO2: 100% 100% 98% 100%  Weight:      Height:        Intake/Output Summary (Last 24 hours) at 08/12/2023 1421 Last data filed at 08/12/2023 1415 Gross per 24 hour  Intake 2560 ml  Output --  Net 2560 ml    Filed Weights   08/07/23 0556 08/07/23 1327  Weight: 75.2 kg 75.2 kg    Examination:   General.  Well-developed lady, in no acute distress. Pulmonary.  Lungs clear bilaterally, normal respiratory effort. CV.  Regular rate and rhythm, no JVD, rub or murmur. Abdomen.  Soft, nontender, nondistended, BS positive. CNS.  Alert and oriented .  No focal neurologic deficit. Extremities.  No edema,  pulses intact and symmetrical. Right upper extremity with bandage. Psychiatry.  Judgment and insight appears normal.  Data Reviewed: I have personally reviewed labs and imaging studies  Time spent: 45 minutes  Arnetha Courser, MD [facility] If 7PM-7AM, please contact night-coverage 08/12/2023, 2:21 PM

## 2023-08-13 NOTE — Progress Notes (Signed)
  Subjective: 6 Days Post-Op Procedure(s) (LRB): IRRIGATION AND DEBRIDEMENT WOUND (Right) Patient reports pain as mild.  Overall significant improvement in pain, swelling and range of motion at the digits. Plan is to go Home today or tomorrow Negative for chest pain and shortness of breath Fever: no Gastrointestinal:Negative for nausea and vomiting  Objective: Vital signs in last 24 hours: Temp:  [98.1 F (36.7 C)-98.7 F (37.1 C)] 98.7 F (37.1 C) (03/23 0818) Pulse Rate:  [62-84] 62 (03/23 0818) Resp:  [13-18] 18 (03/23 0436) BP: (114-129)/(57-65) 116/65 (03/23 0818) SpO2:  [98 %-100 %] 100 % (03/23 0818)  Intake/Output from previous day:  Intake/Output Summary (Last 24 hours) at 08/13/2023 0829 Last data filed at 08/13/2023 0444 Gross per 24 hour  Intake 1300 ml  Output 600 ml  Net 700 ml    Intake/Output this shift: No intake/output data recorded.  Labs: Recent Labs    08/10/23 1124 08/11/23 0439  HGB 12.2 12.1   Recent Labs    08/10/23 1124 08/11/23 0439  WBC 4.0 4.9  RBC 4.18 4.12  HCT 36.5 36.9  PLT 219 230   Recent Labs    08/11/23 0439  NA 139  K 4.3  CL 103  CO2 28  BUN 15  CREATININE 0.47  GLUCOSE 84  CALCIUM 8.5*   No results for input(s): "LABPT", "INR" in the last 72 hours.   EXAM General - Patient is Alert, Appropriate, and Oriented Extremity - Bulky dressing intact to the right hand. Dressing removed, new dressing applied.  Incision site is intact, sutures are intact.  No purulent drainage.  Mild granulation tissue present along the volar aspect, base of fourth digit.  She has full active flexion and slightly limited active extension of the fourth and fifth digits.  No swelling throughout the hand, she is nontender to palpation.  Sensation is intact distally throughout the digits    Past Medical History:  Diagnosis Date   Anxiety and depression    IV drug user     Assessment/Plan: 6 Days Post-Op Procedure(s) (LRB): IRRIGATION  AND DEBRIDEMENT WOUND (Right) Principal Problem:   Cellulitis Active Problems:   Polysubstance abuse (HCC)   Transaminitis  Estimated body mass index is 25.97 kg/m as calculated from the following:   Height as of this encounter: 5\' 7"  (1.702 m).   Weight as of this encounter: 75.2 kg.  Dressing change performed, continue dry dressing changes as needed. Cultures from surgery demonstrated MRSA.  Continue IV Linzolid.  ID Consulted  Encouraged her to work on gentle ROM of the fingers.  Continue to keep right hand elevated above heart level.  Follow-up with [facility] orthopedics in 5-7 days for recheck   T. Cranston Neighbor, PA-C [facility] 08/13/2023, 8:29 AM

## 2023-08-13 NOTE — Progress Notes (Signed)
 PROGRESS NOTE    Emily Hutchinson  [id number] DOB: 02/08/1981 DOA: 08/07/2023 PCP: Pcp, No  226A/226A-AA  LOS: 6 days   Brief hospital course:  Emily Hutchinson is a 43 y.o. female with medical history significant of polysubstance abuse, IV drug use presenting with right extremity cellulitis.  Patient reports significant right upper extremity swelling and pain over the past 2 to 3 days.  Does report using IV drug use in the affected arm roughly 2 to 3 days prior.  Denies any injections directly into the hand.   3/22: Remained hemodynamically stable, continuing IV antibiotics over the weekend.  3/23: Dressing changed by orthopedic surgery, significantly improved wound.  Will continue with IV linezolid today, will try [facility] tomorrow before discharge.  Assessment & Plan:   MRSA abscess of rt ringer finger and hand MRSA tenosynovitis of the flexor tendon of the ring finger --s/p OR I/D on 3/17 with cx obtained which is pos for MRSA.  Started on vanc, cefepime and flagyl on presentation.  ID consulted, started on IV Linezolid.   --cont IV linezolid while inpatient (ID favors the longer on IV abx the better). --on the day of discharge, will give a dose of orbactiv, which will last a week, f/b 2 weeks of doxy. --Prefer to keep pt on IV Linezolid until early next week.  If pt wants to leave over the weekend, pls give orbactiv prior to discharge, and start doxy 1 week later (Rx doxy already filled and [facility]). --Following discharge, follow-up with [facility] orthopaedics in 3-5 days for skin check.  --Non-weightbearing to the right hand.   Transaminitis Hx of HCV exposure AST 236, ALT 156 on presentation  --hepatitis panel pos for HCV.  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 --ID consulted --check RNA-pending results  Polysubstance abuse (HCC) --crystal meth, fentanyl and heroin  --current UDS pos for amphetamine.  Prior UDS was pos for cocaine.  Opioids withdrawal --cont  methadone 10 mg BID --pt is interested in continuing methadone after discharge.  Pls check Medicaid coverage for methadone.  Current smoker --nicotine patch   DVT prophylaxis: Lovenox SQ Code Status: Full code  Family Communication:  Level of care: Med-Surg Dispo:   The patient is from: home Anticipated d/c is to: home Anticipated d/c date is: Monday    Subjective and Interval History:  Patient was eating breakfast when seen today.  Pain seems well-controlled.  Improved range of motion.  Objective: Vitals:   08/13/23 0818 08/13/23 0843 08/13/23 0926 08/13/23 1533  BP: 116/65   103/70  Pulse: 62   90  Resp:  18 20 16   Temp: 98.7 F (37.1 C)   98.9 F (37.2 C)  TempSrc:      SpO2: 100%   98%  Weight:      Height:        Intake/Output Summary (Last 24 hours) at 08/13/2023 1535 Last data filed at 08/13/2023 0444 Gross per 24 hour  Intake --  Output 600 ml  Net -600 ml    Filed Weights   08/07/23 0556 08/07/23 1327  Weight: 75.2 kg 75.2 kg    Examination:   General.  Well-developed lady, in no acute distress. Pulmonary.  Lungs clear bilaterally, normal respiratory effort. CV.  Regular rate and rhythm, no JVD, rub or murmur. Abdomen.  Soft, nontender, nondistended, BS positive. CNS.  Alert and oriented .  No focal neurologic deficit. Extremities.  No edema, pulses intact and symmetrical. Right hand with bandage Psychiatry.  Judgment and insight appears normal.     Data Reviewed: I have personally reviewed labs and imaging studies  Time spent: 44 minutes  This record has been created using Conservation officer, historic buildings. Errors have been sought and corrected,but may not always be located. Such creation errors do not reflect on the standard of care.   Arnetha Courser, MD [facility] If 7PM-7AM, please contact night-coverage 08/13/2023, 3:35 PM

## 2023-08-13 NOTE — Plan of Care (Signed)

## 2023-08-14 ENCOUNTER — Other Ambulatory Visit (HOSPITAL_COMMUNITY): Payer: Self-pay

## 2023-08-14 ENCOUNTER — Other Ambulatory Visit: Payer: Self-pay

## 2023-08-14 LAB — CBC
HCT: 34.5 % — ABNORMAL LOW (ref 36.0–46.0)
Hemoglobin: 11.4 g/dL — ABNORMAL LOW (ref 12.0–15.0)
MCH: 28.9 pg (ref 26.0–34.0)
MCHC: 33 g/dL (ref 30.0–36.0)
MCV: 87.3 fL (ref 80.0–100.0)
Platelets: 238 10*3/uL (ref 150–400)
RBC: 3.95 MIL/uL (ref 3.87–5.11)
RDW: 13.9 % (ref 11.5–15.5)
WBC: 6 10*3/uL (ref 4.0–10.5)
nRBC: 0 % (ref 0.0–0.2)

## 2023-08-14 LAB — BASIC METABOLIC PANEL
Anion gap: 9 (ref 5–15)
BUN: 23 mg/dL — ABNORMAL HIGH (ref 6–20)
CO2: 26 mmol/L (ref 22–32)
Calcium: 8.9 mg/dL (ref 8.9–10.3)
Chloride: 101 mmol/L (ref 98–111)
Creatinine, Ser: 0.63 mg/dL (ref 0.44–1.00)
GFR, Estimated: >60 mL/min (ref >60)
Glucose, Bld: 84 mg/dL (ref 70–99)
Potassium: 4.5 mmol/L (ref 3.5–5.1)
Sodium: 136 mmol/L (ref 135–145)

## 2023-08-14 LAB — HCV RNA QUANT: HCV Quantitative: NOT DETECTED [IU]/mL (ref >50)

## 2023-08-14 LAB — MAGNESIUM: Magnesium: 2.2 mg/dL (ref 1.7–2.4)

## 2023-08-14 MED ORDER — BACITRACIN 500 UNIT/GM EX OINT: 1.0000 | TOPICAL_OINTMENT | Freq: Every day | CUTANEOUS | 0 refills | Status: DC

## 2023-08-14 MED ORDER — METHADONE HCL 10 MG PO TABS
10.0000 mg | ORAL_TABLET | Freq: Two times a day (BID) | ORAL | 0 refills | Status: AC
  Filled 2023-08-14: qty 6, 3d supply, fill #0

## 2023-08-14 MED ORDER — NICOTINE 21 MG/24HR TD PT24
21.0000 mg | MEDICATED_PATCH | Freq: Every day | TRANSDERMAL | 0 refills | Status: AC
  Filled 2023-08-14: qty 14, 14d supply, fill #0

## 2023-08-14 MED ORDER — DEXTROSE 5 % IV SOLN
1500.0000 mg | Freq: Once | INTRAVENOUS | Status: AC
  Administered 2023-08-14: 1500 mg via INTRAVENOUS
  Filled 2023-08-14: qty 75

## 2023-08-14 MED ORDER — MUPIROCIN 2 % EX OINT
1.0000 | TOPICAL_OINTMENT | Freq: Every day | CUTANEOUS | 0 refills | Status: AC
Start: 2023-08-14 — End: ?

## 2023-08-14 NOTE — Discharge Summary (Signed)
 Physician Discharge Summary   Patient: Emily Hutchinson MRN: [id number] DOB: 1980/09/13  Admit date:     08/07/2023  Discharge date: 08/14/23  Discharge Physician: Arnetha Courser   PCP: Pcp, No   Recommendations at discharge:  Please obtain CBC and BMP on follow-up Follow-up with primary care provider Follow-up with orthopedic surgery Follow-up at the methadone clinic Follow-up with infectious disease Please follow-up on HCV RNA results.  Discharge Diagnoses: Principal Problem:   Cellulitis Active Problems:   Polysubstance abuse [facility])   Transaminitis   Hospital Course: Emily Hutchinson is a 43 y.o. female with medical history significant of polysubstance abuse, IV drug use presenting with right extremity cellulitis.  Patient reports significant right upper extremity swelling and pain over the past 2 to 3 days.  Does report using IV drug use in the affected arm roughly 2 to 3 days prior.  Denies any injections directly into the hand.   She was found to have a abscess of right ring finger and hand s/p incision and drainage done by orthopedic surgery, cultures grew MRSA.  Patient initially received broad-spectrum antibiotics which was later transitioned to IV linezolid.  Patient received 7 days of IV linezolid followed by 1 dose of dalbavancin and is being discharged on doxycycline.    She was counseled for polysubstance abuse especially IV drug use.  She was restarted on methadone during current hospitalization and given 3 days worth of dose so she can go back to her methadone clinic.  Apparently she used to follow-up with methadone clinic and then relapsed.  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  ID ordered quantitative HCVRNA with pending results.  Patient will continue on current medications and need to have a close follow-up with her providers for further management.  Consultants: Infectious disease.  Orthopedic surgery. Procedures  performed: Incision and drainage Disposition: ToHome Diet recommendation:  Discharge Diet Orders (From admission, onward)     Start     Ordered   08/14/23 0000  Diet - low sodium heart healthy        08/14/23 1020           Regular diet DISCHARGE MEDICATION: Allergies as of 08/14/2023       Reactions   Penicillins         Medication List     TAKE these medications    doxycycline 100 MG tablet Commonly known as: VIBRA-TABS Take 1 tablet (100 mg total) by mouth 2 (two) times daily.   methadone 10 MG tablet Commonly known as: DOLOPHINE Take 1 tablet (10 mg total) by mouth 2 (two) times daily for 3 days.   mupirocin ointment 2 % Commonly known as: BACTROBAN Apply 1 Application topically daily.   nicotine 21 mg/24hr patch Commonly known as: NICODERM CQ - dosed in mg/24 hours Place 1 patch (21 mg total) onto the skin daily.        Follow-up Information     Poggi, Excell Seltzer, MD. Schedule an appointment as soon as possible for a visit in 1 week(s).   Specialty: Orthopedic Surgery Contact information: [address] [facility] [place] [address] [phone]                Discharge Exam: Ceasar Mons Weights   08/07/23 0556 08/07/23 1327  Weight: 75.2 kg 75.2 kg   General.  Well-developed lady, in no acute distress. Pulmonary.  Lungs clear bilaterally, normal respiratory effort. CV.  Regular rate and  rhythm, no JVD, rub or murmur. Abdomen.  Soft, nontender, nondistended, BS positive. CNS.  Alert and oriented .  No focal neurologic deficit. Extremities.  No edema, pulses intact and symmetrical.  Right hand with bandage. Psychiatry.  Judgment and insight appears normal.   Condition at discharge: stable  The results of significant diagnostics from this hospitalization (including imaging, microbiology, ancillary and laboratory) are listed below for reference.   Imaging Studies: CT HAND RIGHT W CONTRAST Result Date: 08/07/2023 CLINICAL  DATA:  Hand swelling extending into the ring finger for 3 days. History of polysubstance abuse. EXAM: CT OF THE UPPER RIGHT EXTREMITY WITH CONTRAST TECHNIQUE: Multidetector CT imaging of the right hand was performed according to the standard protocol following intravenous contrast administration. RADIATION DOSE REDUCTION: This exam was performed according to the departmental dose-optimization program which includes automated exposure control, adjustment of the mA and/or kV according to patient size and/or use of iterative reconstruction technique. CONTRAST:  75mL OMNIPAQUE IOHEXOL 300 MG/ML  SOLN COMPARISON:  Radiographs 08/07/2023 FINDINGS: Bones/Joint/Cartilage No evidence of acute fracture, dislocation or osteomyelitis. There is an old fracture of the 5th metacarpal shaft. The joint spaces are preserved. No significant joint effusions are identified. Ligaments Suboptimally assessed by CT. Muscles and Tendons As evaluated by CT, the flexor and extensor tendons appear intact. Difficult to exclude mild tenosynovitis of the flexor tendon proximally in the ring finger. No focal muscular abnormalities are identified. Soft tissues Extensive dorsal subcutaneous edema throughout the hand without focal fluid collection or suspicious associated enhancement. There is also prominent focal soft tissue swelling within the volar aspect of the proximal ring finger with mild surrounding heterogeneous soft tissue enhancement. No organized fluid collection, foreign body or soft tissue emphysema identified. As above, possible associated flexor tenosynovitis. IMPRESSION: 1. Prominent focal soft tissue swelling within the volar aspect of the proximal ring finger with mild surrounding heterogeneous soft tissue enhancement, suspicious for cellulitis. Possible associated flexor tenosynovitis. No organized fluid collection, foreign body or soft tissue emphysema identified. 2. Extensive dorsal subcutaneous edema throughout the hand without  focal fluid collection or suspicious associated enhancement, likely cellulitis. 3. No evidence of osteomyelitis or septic arthritis. 4. Old fracture of the 5th metacarpal shaft. Electronically Signed   By: Carey Bullocks M.D.   On: 08/07/2023 09:45   DG Hand Complete Right Result Date: 08/07/2023 CLINICAL DATA:  Right hand swelling with concern for infection. EXAM: RIGHT HAND - COMPLETE 3+ VIEW COMPARISON:  None Available. FINDINGS: Soft tissue swelling in the hand and distal forearm with pronounced involvement of the ring finger and dorsal hand. No gas, opaque foreign body, or bony erosion. Old fifth metacarpal shaft fracture which is healed. IMPRESSION: Pronounced soft tissue swelling without gas, opaque foreign body, or erosion. Electronically Signed   By: Tiburcio Pea M.D.   On: 08/07/2023 07:26   DG Chest Port 1 View Result Date: 08/07/2023 CLINICAL DATA:  Questionable sepsis EXAM: PORTABLE CHEST 1 VIEW COMPARISON:  03/10/2018 FINDINGS: Normal heart size and mediastinal contours. No acute infiltrate or edema. No effusion or pneumothorax. No acute osseous findings. IMPRESSION: No evidence of active disease. Electronically Signed   By: Tiburcio Pea M.D.   On: 08/07/2023 07:25    Microbiology: Results for orders placed or performed during the hospital encounter of 08/07/23  Blood Culture (routine x 2)     Status: None   Collection Time: 08/07/23  6:45 AM   Specimen: BLOOD RIGHT ARM  Result Value Ref Range Status   Specimen Description  BLOOD RIGHT ARM  Final   Special Requests   Final    BOTTLES DRAWN AEROBIC AND ANAEROBIC Blood Culture results may not be optimal due to an inadequate volume of blood received in culture bottles   Culture   Final    NO GROWTH 5 DAYS Performed at [facility], [address] [place], [place] [address]    Report Status 08/12/2023 FINAL  Final  Blood Culture (routine x 2)     Status: None   Collection Time: 08/07/23  6:45 AM   Specimen: BLOOD  LEFT ARM  Result Value Ref Range Status   Specimen Description BLOOD LEFT ARM  Final   Special Requests   Final    BOTTLES DRAWN AEROBIC AND ANAEROBIC Blood Culture results may not be optimal due to an inadequate volume of blood received in culture bottles   Culture   Final    NO GROWTH 5 DAYS Performed at [facility], [address], [place], [place] [address]    Report Status 08/12/2023 FINAL  Final  Aerobic/Anaerobic Culture w Gram Stain (surgical/deep wound)     Status: None   Collection Time: 08/07/23  3:41 PM   Specimen: Wound  Result Value Ref Range Status   Specimen Description   Final    WOUND Performed at [facility], [address], [place], [place] [address]    Special Requests RIGHT RING FINGER SPEC A  Final   Gram Stain NO WBC SEEN RARE GRAM POSITIVE COCCI   Final   Culture   Final    ABUNDANT METHICILLIN RESISTANT STAPHYLOCOCCUS AUREUS NO ANAEROBES ISOLATED Performed at [facility], [address], [place], [place] [address]    Report Status 08/12/2023 FINAL  Final   Organism ID, Bacteria METHICILLIN RESISTANT STAPHYLOCOCCUS AUREUS  Final      Susceptibility   Methicillin resistant staphylococcus aureus - MIC*    CIPROFLOXACIN >=8 RESISTANT Resistant     ERYTHROMYCIN >=8 RESISTANT Resistant     GENTAMICIN <=0.5 SENSITIVE Sensitive     OXACILLIN >=4 RESISTANT Resistant     TETRACYCLINE <=1 SENSITIVE Sensitive     VANCOMYCIN 1 SENSITIVE Sensitive     TRIMETH/SULFA >=320 RESISTANT Resistant     CLINDAMYCIN <=0.25 SENSITIVE Sensitive     RIFAMPIN <=0.5 SENSITIVE Sensitive     Inducible Clindamycin NEGATIVE Sensitive     LINEZOLID 2 SENSITIVE Sensitive     * ABUNDANT METHICILLIN RESISTANT STAPHYLOCOCCUS AUREUS  Aerobic/Anaerobic Culture w Gram Stain (surgical/deep wound)     Status: None   Collection Time: 08/07/23  3:43 PM   Specimen: Wound  Result Value Ref Range Status   Specimen Description   Final    WOUND Performed at  [facility], [address] [place], [place] [address]    Special Requests RIGHT RING FINGER SPEC B  Final   Gram Stain NO WBC SEEN RARE GRAM POSITIVE COCCI IN PAIRS   Final   Culture   Final    ABUNDANT METHICILLIN RESISTANT STAPHYLOCOCCUS AUREUS NO ANAEROBES ISOLATED Performed at [facility], [address] [place], [place] [address]    Report Status 08/12/2023 FINAL  Final   Organism ID, Bacteria METHICILLIN RESISTANT STAPHYLOCOCCUS AUREUS  Final      Susceptibility   Methicillin resistant staphylococcus aureus - MIC*    CIPROFLOXACIN >=8 RESISTANT Resistant     ERYTHROMYCIN >=8 RESISTANT Resistant     GENTAMICIN <=0.5 SENSITIVE Sensitive     OXACILLIN >=4  RESISTANT Resistant     TETRACYCLINE <=1 SENSITIVE Sensitive     VANCOMYCIN 1 SENSITIVE Sensitive     TRIMETH/SULFA >=320 RESISTANT Resistant     CLINDAMYCIN <=0.25 SENSITIVE Sensitive     RIFAMPIN <=0.5 SENSITIVE Sensitive     Inducible Clindamycin NEGATIVE Sensitive     LINEZOLID 2 SENSITIVE Sensitive     * ABUNDANT METHICILLIN RESISTANT STAPHYLOCOCCUS AUREUS  Aerobic/Anaerobic Culture w Gram Stain (surgical/deep wound)     Status: None   Collection Time: 08/07/23  3:44 PM   Specimen: Wound; Tissue  Result Value Ref Range Status   Specimen Description TISSUE  Final   Special Requests RIGHT RING FINGER SPEC C  Final   Gram Stain NO WBC SEEN RARE GRAM POSITIVE COCCI   Final   Culture   Final    MODERATE METHICILLIN RESISTANT STAPHYLOCOCCUS AUREUS NO ANAEROBES ISOLATED Performed at [facility], [address], [place], [place] [address]    Report Status 08/12/2023 FINAL  Final   Organism ID, Bacteria METHICILLIN RESISTANT STAPHYLOCOCCUS AUREUS  Final      Susceptibility   Methicillin resistant staphylococcus aureus - MIC*    CIPROFLOXACIN >=8 RESISTANT Resistant     ERYTHROMYCIN >=8 RESISTANT Resistant     GENTAMICIN <=0.5 SENSITIVE Sensitive     OXACILLIN >=4 RESISTANT Resistant      TETRACYCLINE <=1 SENSITIVE Sensitive     VANCOMYCIN 1 SENSITIVE Sensitive     TRIMETH/SULFA >=320 RESISTANT Resistant     CLINDAMYCIN <=0.25 SENSITIVE Sensitive     RIFAMPIN <=0.5 SENSITIVE Sensitive     Inducible Clindamycin NEGATIVE Sensitive     LINEZOLID 2 SENSITIVE Sensitive     * MODERATE METHICILLIN RESISTANT STAPHYLOCOCCUS AUREUS  Aerobic/Anaerobic Culture w Gram Stain (surgical/deep wound)     Status: None   Collection Time: 08/07/23  4:02 PM   Specimen: Wound  Result Value Ref Range Status   Specimen Description   Final    WOUND Performed at [facility], [address] [place], [place] [address]    Special Requests RIGHT DORSAL HAND  Final   Gram Stain NO WBC SEEN NO ORGANISMS SEEN   Final   Culture   Final    RARE METHICILLIN RESISTANT STAPHYLOCOCCUS AUREUS NO ANAEROBES ISOLATED Performed at [facility], [address], [place], [place] [address]    Report Status 08/12/2023 FINAL  Final   Organism ID, Bacteria METHICILLIN RESISTANT STAPHYLOCOCCUS AUREUS  Final      Susceptibility   Methicillin resistant staphylococcus aureus - MIC*    CIPROFLOXACIN >=8 RESISTANT Resistant     ERYTHROMYCIN >=8 RESISTANT Resistant     GENTAMICIN <=0.5 SENSITIVE Sensitive     OXACILLIN >=4 RESISTANT Resistant     TETRACYCLINE <=1 SENSITIVE Sensitive     VANCOMYCIN 1 SENSITIVE Sensitive     TRIMETH/SULFA >=320 RESISTANT Resistant     CLINDAMYCIN <=0.25 SENSITIVE Sensitive     RIFAMPIN <=0.5 SENSITIVE Sensitive     Inducible Clindamycin NEGATIVE Sensitive     LINEZOLID 2 SENSITIVE Sensitive     * RARE METHICILLIN RESISTANT STAPHYLOCOCCUS AUREUS    Labs: CBC: Recent Labs  Lab 08/08/23 0426 08/09/23 0514 08/10/23 1124 08/11/23 0439 08/14/23 0545  WBC 7.0 4.5 4.0 4.9 6.0  HGB 11.1* 11.4* 12.2 12.1 11.4*  HCT 33.5* 34.3* 36.5 36.9 34.5*  MCV 87.2 88.6 87.3 89.6 87.3  PLT 179 199 219 230 238   Basic Metabolic Panel: Recent Labs  Lab 08/08/23 0426  08/09/23 0514 08/11/23 0439 08/14/23 0545  NA 137 139 139 136  K 3.9 3.8 4.3 4.5  CL 108 112* 103 101  CO2 23 24 28 26   GLUCOSE 118* 91 84 84  BUN 10 15 15  23*  CREATININE 0.51 0.56 0.47 0.63  CALCIUM 8.5* 7.9* 8.5* 8.9  MG  --  2.0 2.1 2.2   Liver Function Tests: Recent Labs  Lab 08/08/23 0426  AST 64*  ALT 98*  ALKPHOS 162*  BILITOT 1.0  PROT 6.2*  ALBUMIN 2.8*   CBG: Recent Labs  Lab 08/09/23 0738  GLUCAP 142*    Discharge time spent: greater than 30 minutes.  This record has been created using Conservation officer, historic buildings. Errors have been sought and corrected,but may not always be located. Such creation errors do not reflect on the standard of care.   Signed: Arnetha Courser, MD [facility] 08/14/2023

## 2023-08-14 NOTE — TOC Transition Note (Addendum)
 Transition of Care [facility]) - Discharge Note   Patient Details  Name: Ruhi Kopke MRN: [id number] Date of Birth: 1980/06/30  [facility] CM/SW Contact:  Chapman Fitch, RN Phone Number: 08/14/2023, 10:32 AM   Clinical Narrative:     Patient to receive meds to bed prior to discharge All discharge resources have already been added to AVS    Update:  cab voucher provided.  Bedside RN to get patient to sign rider waiver    Patient Goals and CMS Choice            Discharge Placement                       Discharge Plan and Services Additional resources added to the After Visit Summary for                                       Social Drivers of Health (SDOH) Interventions SDOH Screenings   Food Insecurity: Food Insecurity Present (08/07/2023)  Housing: High Risk (08/07/2023)  Transportation Needs: Unmet Transportation Needs (08/07/2023)  Utilities: At Risk (08/07/2023)  Alcohol Screen: Low Risk  (04/01/2017)  Social Connections: Socially Isolated (08/07/2023)  Tobacco Use: High Risk (08/07/2023)     Readmission Risk Interventions     No data to display

## 2023-08-14 NOTE — Plan of Care (Signed)

## 2023-08-21 NOTE — Anesthesia Postprocedure Evaluation (Signed)
 Anesthesia Post Note  Patient: Emily Hutchinson  Procedure(s) Performed: IRRIGATION AND DEBRIDEMENT WOUND (Right)  Patient location during evaluation: PACU Anesthesia Type: General Level of consciousness: awake and alert Pain management: pain level controlled Vital Signs Assessment: post-procedure vital signs reviewed and stable Respiratory status: spontaneous breathing, nonlabored ventilation, respiratory function stable and patient connected to nasal cannula oxygen Cardiovascular status: blood pressure returned to baseline and stable Postop Assessment: no apparent nausea or vomiting Anesthetic complications: no   There were no known notable events for this encounter.   Last Vitals:  Vitals:   08/14/23 0223 08/14/23 0938  BP: 106/61 133/75  Pulse: 70 82  Resp: 20 16  Temp: 37 C 37.2 C  SpO2: 97% 98%    Last Pain:  Vitals:   08/14/23 1106  TempSrc:   PainSc: 0-No pain                 Lenard Simmer

## 2023-08-24 ENCOUNTER — Inpatient Hospital Stay: Payer: Self-pay | Admitting: Infectious Diseases

## 2023-12-21 IMAGING — CT CT HEAD W/O CM
4 series · 16 of 47 positions shown, 18 images · non-contrast
Comparison: Head CT 03/10/2018

CLINICAL DATA: Mental status change, unknown cause



[Series 2: head wo · axial · 0.43mm/px · z∈[-66,+54]mm · 7 of 32 slices shown, 9 images]
[im 4/32  brain]
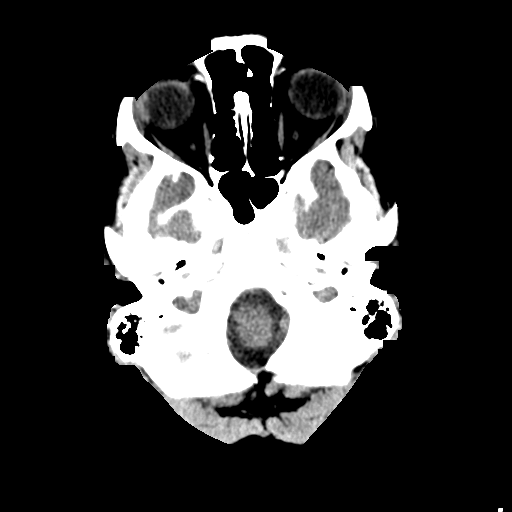
[im 4/32  bone]
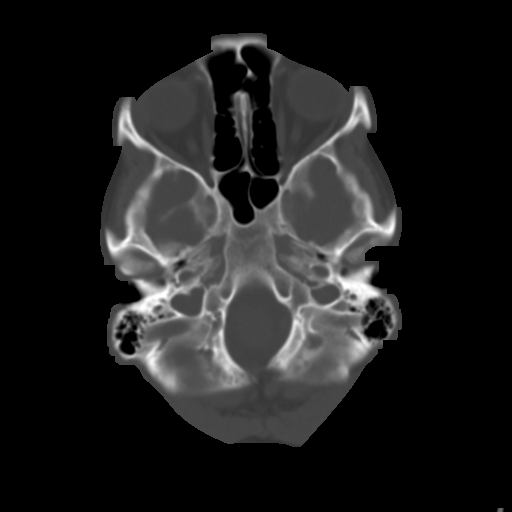
[im 8/32  brain]
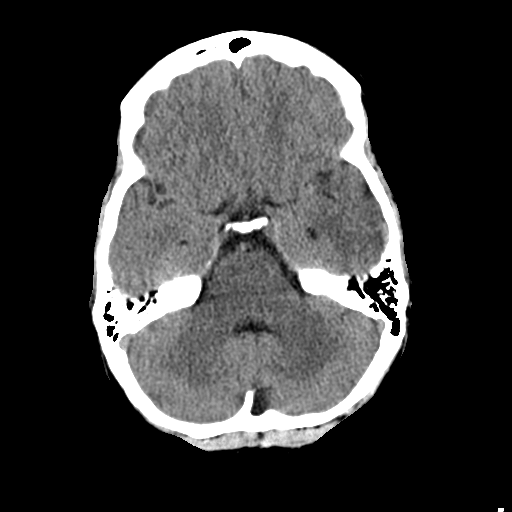
[im 12/32  brain]
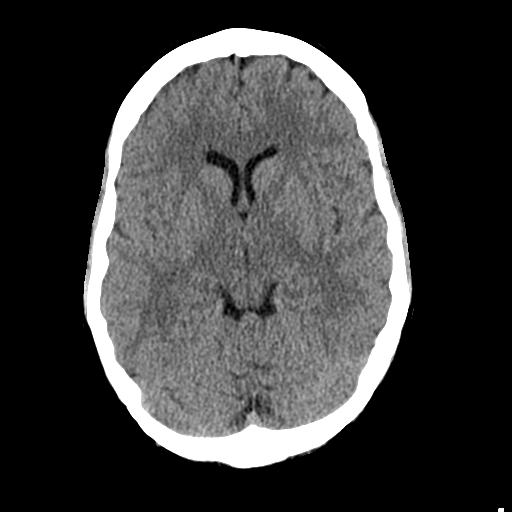
[im 16/32  brain]
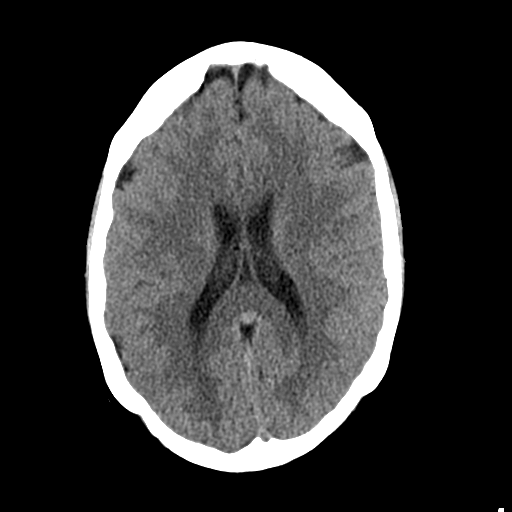
[im 20/32  brain]
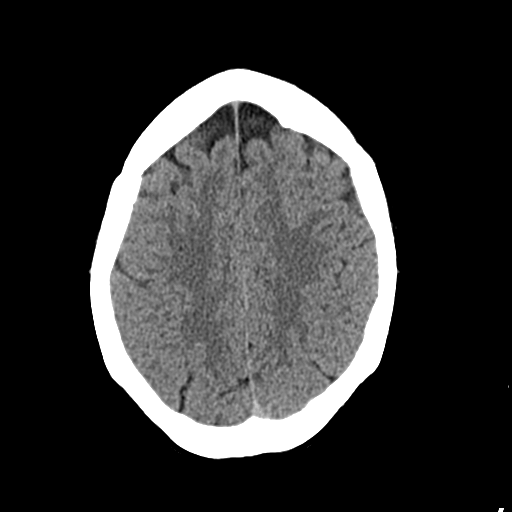
[im 20/32  bone]
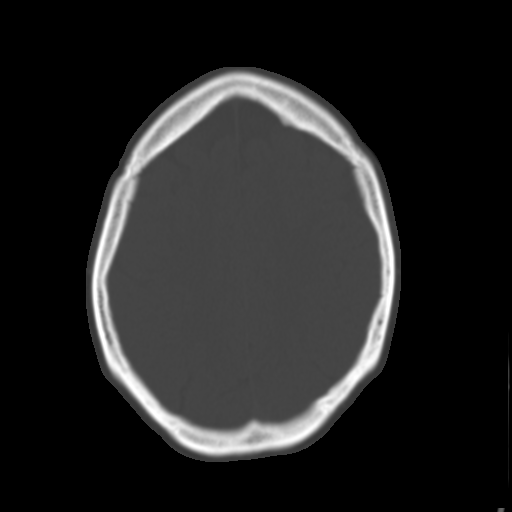
[im 24/32  brain]
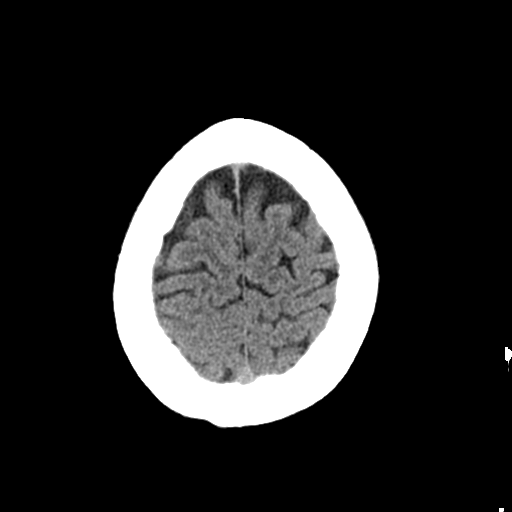
[im 28/32  brain]
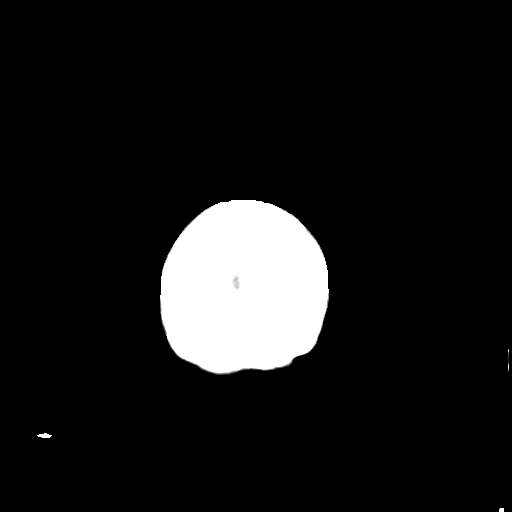

[Series 3: head bone · axial · 0.43mm/px · z∈[-67,-35]mm · 3 of 79 slices shown]
[im 8/79  bone]
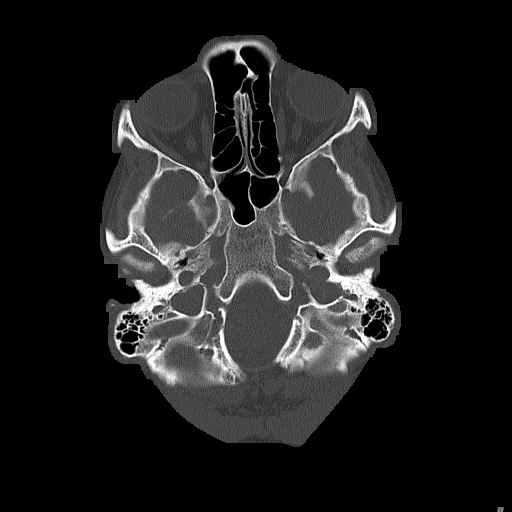
[im 16/79  bone]
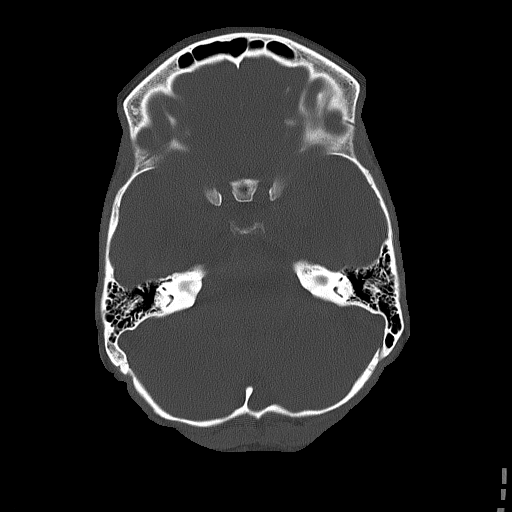
[im 24/79  bone]
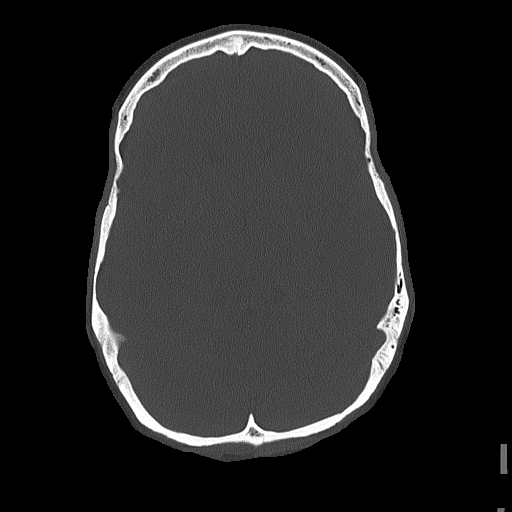

[Series 4: coronal soft tissue · coronal · 0.32mm/px · 3 of 67 slices shown]
[im 23/67  brain]
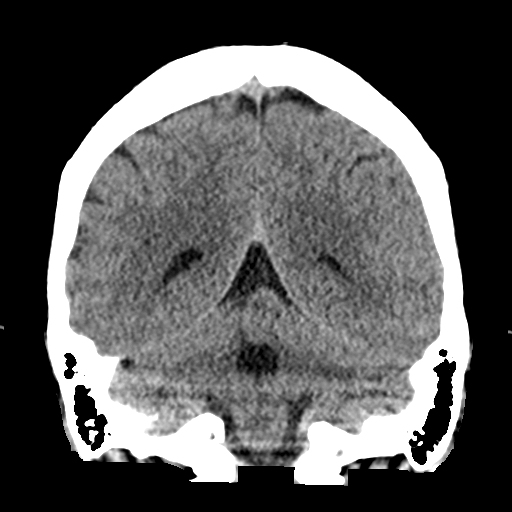
[im 30/67  brain]
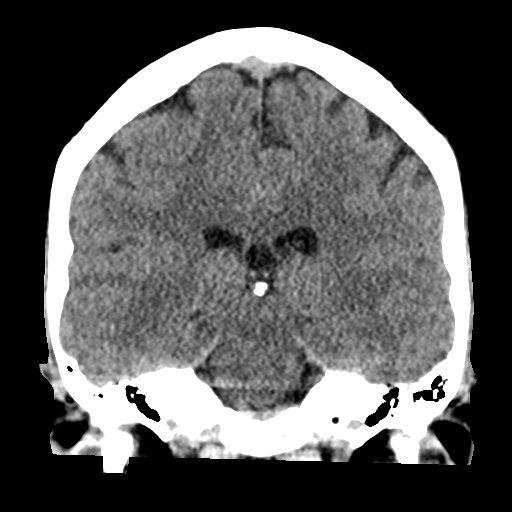
[im 37/67  brain]
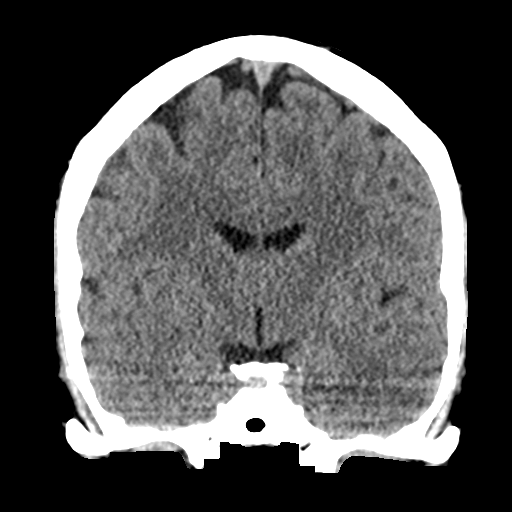

[Series 5: sagittal soft tissue · sagittal · 0.32mm/px · 3 of 52 slices shown]
[im 18/52  brain]
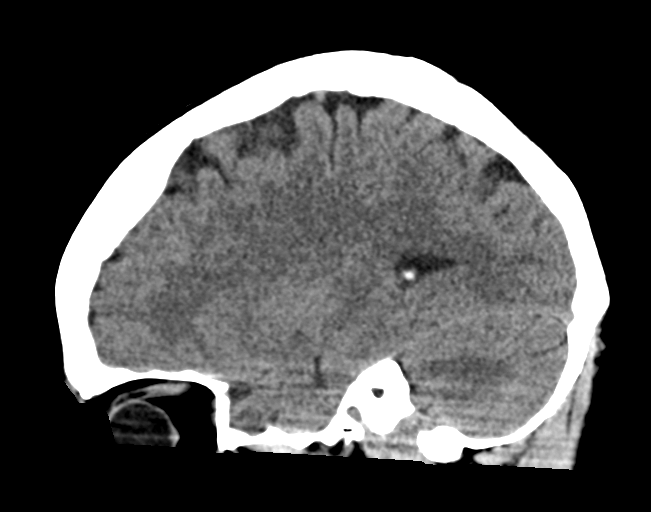
[im 26/52  brain]
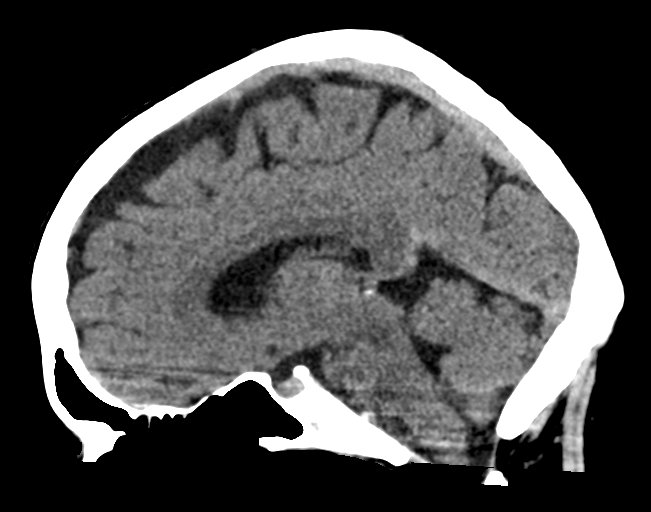
[im 35/52  brain]
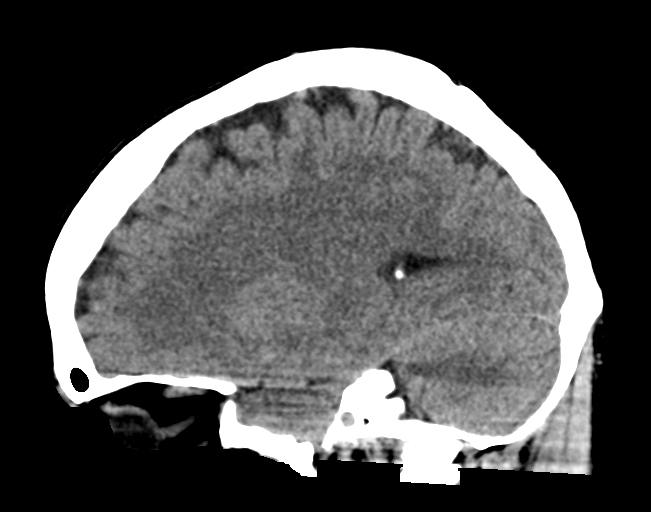

[16 of 47 positions shown; findings below may reference images not displayed]

FINDINGS: Brain: No intracranial hemorrhage, mass effect, or midline shift. No
hydrocephalus. The basilar cisterns are patent. No evidence of
territorial infarct or acute ischemia. Gray-white differentiation is
preserved. No extra-axial or intracranial fluid collection.

Vascular: No hyperdense vessel or unexpected calcification.

Skull: No fracture or focal lesion.

Sinuses/Orbits: Paranasal sinuses and mastoid air cells are clear.
The visualized orbits are unremarkable.

Other: None.
IMPRESSION: Negative noncontrast head CT.
